# Patient Record
Sex: Male | Born: 1945 | Race: Black or African American | Hispanic: No | Marital: Married | Smoking: Former smoker
Health system: Southern US, Community
[De-identification: ages and names within clinical notes are randomized; demographics above are authoritative.]

## PROBLEM LIST (undated history)

## (undated) DIAGNOSIS — K219 Gastro-esophageal reflux disease without esophagitis: Secondary | ICD-10-CM

## (undated) DIAGNOSIS — I10 Essential (primary) hypertension: Secondary | ICD-10-CM

---

## 2009-06-29 ENCOUNTER — Emergency Department (HOSPITAL_COMMUNITY): Admission: EM | Admit: 2009-06-29 | Discharge: 2009-06-29 | Payer: Self-pay | Admitting: Emergency Medicine

## 2011-02-27 LAB — POCT I-STAT, CHEM 8
Calcium, Ion: 1.11 mmol/L — ABNORMAL LOW (ref 1.12–1.32)
Chloride: 102 mEq/L (ref 96–112)
Glucose, Bld: 111 mg/dL — ABNORMAL HIGH (ref 70–99)
HCT: 57 % — ABNORMAL HIGH (ref 39.0–52.0)
Hemoglobin: 19.4 g/dL — ABNORMAL HIGH (ref 13.0–17.0)
TCO2: 23 mmol/L (ref 0–100)

## 2013-06-30 ENCOUNTER — Other Ambulatory Visit (HOSPITAL_COMMUNITY): Payer: Medicare Other

## 2013-06-30 ENCOUNTER — Encounter (HOSPITAL_COMMUNITY): Payer: Self-pay | Admitting: Unknown Physician Specialty

## 2013-06-30 ENCOUNTER — Emergency Department (HOSPITAL_COMMUNITY): Payer: Non-veteran care

## 2013-06-30 ENCOUNTER — Emergency Department (HOSPITAL_COMMUNITY)
Admission: EM | Admit: 2013-06-30 | Discharge: 2013-06-30 | Disposition: A | Discharge status: Home / Self Care | Payer: Non-veteran care | Attending: Emergency Medicine | Admitting: Emergency Medicine

## 2013-06-30 DIAGNOSIS — R112 Nausea with vomiting, unspecified: Secondary | ICD-10-CM | POA: Insufficient documentation

## 2013-06-30 DIAGNOSIS — Z79899 Other long term (current) drug therapy: Secondary | ICD-10-CM | POA: Insufficient documentation

## 2013-06-30 DIAGNOSIS — R55 Syncope and collapse: Secondary | ICD-10-CM | POA: Insufficient documentation

## 2013-06-30 DIAGNOSIS — Z87891 Personal history of nicotine dependence: Secondary | ICD-10-CM | POA: Insufficient documentation

## 2013-06-30 DIAGNOSIS — R51 Headache: Secondary | ICD-10-CM | POA: Insufficient documentation

## 2013-06-30 DIAGNOSIS — Z8719 Personal history of other diseases of the digestive system: Secondary | ICD-10-CM | POA: Insufficient documentation

## 2013-06-30 DIAGNOSIS — M542 Cervicalgia: Secondary | ICD-10-CM | POA: Insufficient documentation

## 2013-06-30 DIAGNOSIS — I10 Essential (primary) hypertension: Secondary | ICD-10-CM | POA: Insufficient documentation

## 2013-06-30 HISTORY — DX: Gastro-esophageal reflux disease without esophagitis: K21.9

## 2013-06-30 HISTORY — DX: Essential (primary) hypertension: I10

## 2013-06-30 LAB — GLUCOSE, CAPILLARY: Glucose-Capillary: 126 mg/dL — ABNORMAL HIGH (ref 70–99)

## 2013-06-30 LAB — COMPREHENSIVE METABOLIC PANEL
ALT: 13 U/L (ref 0–53)
Calcium: 9.6 mg/dL (ref 8.4–10.5)
GFR calc Af Amer: >90 mL/min (ref >90)
Glucose, Bld: 120 mg/dL — ABNORMAL HIGH (ref 70–99)
Sodium: 140 mEq/L (ref 135–145)
Total Protein: 7.6 g/dL (ref 6.0–8.3)

## 2013-06-30 LAB — CBC
Hemoglobin: 15 g/dL (ref 13.0–17.0)
MCH: 32.1 pg (ref 26.0–34.0)
MCHC: 35.5 g/dL (ref 30.0–36.0)

## 2013-06-30 LAB — URINALYSIS, ROUTINE W REFLEX MICROSCOPIC
Bilirubin Urine: NEGATIVE
Leukocytes, UA: NEGATIVE
Nitrite: NEGATIVE
Specific Gravity, Urine: 1.021 (ref 1.005–1.030)
pH: 6 (ref 5.0–8.0)

## 2013-06-30 LAB — TROPONIN I: Troponin I: <0.3 ng/mL (ref <0.30)

## 2013-06-30 NOTE — ED Provider Notes (Addendum)
CSN: [id number]     Arrival date & time 06/30/13  1105 History     First MD Initiated Contact with Patient 06/30/13 1109     Chief Complaint  Patient presents with  . Loss of Consciousness   (Consider location/radiation/quality/duration/timing/severity/associated sxs/prior Treatment) HPI Comments: 67 year old Latin American male presents emergency department via EMS for syncopal episode. Patient was eating breakfast and he believes he was eating too fast when he felt full sensation in his stomach felt nauseous, vomited, and briefly lost consciousness. Patient denies recent travel, sick contacts, chest pain shortness of breath, abdominal pain, recent trauma, headache, change in medications  Patient is a 67 y.o. male presenting with syncope. The history is provided by the patient and the spouse.  Loss of Consciousness Episode history:  Single Most recent episode:  Today Progression:  Resolved Chronicity:  New Context: normal activity   Witnessed: yes   Relieved by:  Nothing Worsened by:  Nothing tried Associated symptoms: nausea and vomiting   Associated symptoms: no chest pain, no dizziness, no headaches, no palpitations, no seizures and no weakness   Vomiting:    Quality:  Stomach contents   Severity:  Mild   Past Medical History  Diagnosis Date  . Hypertension   . GERD (gastroesophageal reflux disease)    History reviewed. No pertinent past surgical history. No family history on file. History  Substance Use Topics  . Smoking status: Former Smoker    Quit date: 06/30/2012  . Smokeless tobacco: Not on file  . Alcohol Use: Yes     Comment: occassional    Review of Systems  Constitutional: Negative for chills, activity change and appetite change.  HENT: Positive for neck pain. Negative for hearing loss, facial swelling, neck stiffness and ear discharge.   Eyes: Negative.   Respiratory: Negative for apnea, cough, choking and chest tightness.   Cardiovascular: Positive  for syncope. Negative for chest pain, palpitations and leg swelling.  Gastrointestinal: Positive for nausea and vomiting. Negative for abdominal pain, diarrhea, constipation, blood in stool, abdominal distention, anal bleeding and rectal pain.  Endocrine: Negative.   Genitourinary: Negative.   Neurological: Positive for syncope and light-headedness. Negative for dizziness, tremors, seizures, facial asymmetry, speech difficulty, weakness, numbness and headaches.  Hematological: Negative.     Allergies  Review of patient's allergies indicates no known allergies.  Home Medications   Current Outpatient Rx  Name  Route  Sig  Dispense  Refill  . amLODipine (NORVASC) 10 MG tablet   Oral   Take 5 mg by mouth daily.         Marland Kitchen ascorbic acid (VITAMIN C) 500 MG tablet   Oral   Take 500 mg by mouth 3 (three) times daily with meals.         . fish oil-omega-3 fatty acids 1000 MG capsule   Oral   Take 2 g by mouth daily.         . Multiple Vitamin (DAILY VITE) TABS   Oral   Take 1 tablet by mouth daily.         . ranitidine (ZANTAC) 150 MG tablet   Oral   Take 150 mg by mouth 2 (two) times daily.         Marland Kitchen senna-docusate (SENOKOT-S) 8.6-50 MG per tablet   Oral   Take 2 tablets by mouth 2 (two) times daily.         . tamsulosin (FLOMAX) 0.4 MG CAPS capsule   Oral   Take 0.4  mg by mouth at bedtime.          BP 104/63  Pulse 55  Temp(Src) 97.6 F (36.4 C) (Oral)  Resp 19  SpO2 97% Physical Exam  Nursing note and vitals reviewed. Constitutional: He is oriented to person, place, and time. He appears well-developed and well-nourished.  HENT:  Head: Normocephalic and atraumatic.  Eyes: Conjunctivae and EOM are normal. Pupils are equal, round, and reactive to light.  Neck: Normal range of motion. Neck supple. No JVD present. No tracheal deviation present. No thyromegaly present.  Full range of motion with neck, no step-offs no deviations, no bruits, no JVD   Cardiovascular: Normal rate, regular rhythm and intact distal pulses.   Pulmonary/Chest: Effort normal and breath sounds normal. No stridor.  Abdominal: Soft. Bowel sounds are normal. He exhibits no distension and no mass. There is no tenderness. There is no rebound and no guarding.  Musculoskeletal: Normal range of motion.  Lymphadenopathy:    He has no cervical adenopathy.  Neurological: He is alert and oriented to person, place, and time. He has normal reflexes. No cranial nerve deficit. Coordination normal.  Moving all extremities, muscle strength 5 out of 5 and symmetric, no facial droop, no slurred speech, negative pronator drift the  Skin: Skin is warm and dry.    ED Course    Date: 06/30/2013  Rate: 58  Rhythm: normal sinus rhythm  QRS Axis: normal  Intervals: normal  ST/T Wave abnormalities: nonspecific ST changes  Conduction Disutrbances:none  Narrative Interpretation:   Old EKG Reviewed: none available    Procedures (including critical care time)  Labs Reviewed  COMPREHENSIVE METABOLIC PANEL - Abnormal; Notable for the following:    Glucose, Bld 120 (*)    All other components within normal limits  GLUCOSE, CAPILLARY - Abnormal; Notable for the following:    Glucose-Capillary 126 (*)    All other components within normal limits  CBC  URINALYSIS, ROUTINE W REFLEX MICROSCOPIC  TROPONIN I   Ct Head Wo Contrast (only If Suspected Head Trauma And/or Pt Is On Anticoagulant)  06/30/2013   *RADIOLOGY REPORT*  Clinical Data: Loss of consciousness.  CT HEAD WITHOUT CONTRAST  Technique:  Contiguous axial images were obtained from the base of the skull through the vertex without contrast.  Comparison: None  Findings: Mild atrophy.  Negative for hydrocephalus.  Mild chronic microvascular ischemic changes in the white matter.  No acute infarct.  Negative for hemorrhage or mass.  No skull lesion identified.  IMPRESSION: Atrophy and mild chronic microvascular ischemia.  No acute  abnormality.   Original Report Authenticated By: Janeece Riggers, M.D.   No diagnosis found. Results for orders placed during the hospital encounter of 06/30/13  CBC      Result Value Range   WBC 5.5  4.0 - 10.5 K/uL   RBC 4.67  4.22 - 5.81 MIL/uL   Hemoglobin 15.0  13.0 - 17.0 g/dL   HCT 84.6  96.2 - 95.2 %   MCV 90.4  78.0 - 100.0 fL   MCH 32.1  26.0 - 34.0 pg   MCHC 35.5  30.0 - 36.0 g/dL   RDW 84.1  32.4 - 40.1 %   Platelets 290  150 - 400 K/uL  COMPREHENSIVE METABOLIC PANEL      Result Value Range   Sodium 140  135 - 145 mEq/L   Potassium 3.5  3.5 - 5.1 mEq/L   Chloride 103  96 - 112 mEq/L   CO2 25  19 - 32 mEq/L   Glucose, Bld 120 (*) 70 - 99 mg/dL   BUN 9  6 - 23 mg/dL   Creatinine, Ser 1.61  0.50 - 1.35 mg/dL   Calcium 9.6  8.4 - 09.6 mg/dL   Total Protein 7.6  6.0 - 8.3 g/dL   Albumin 3.8  3.5 - 5.2 g/dL   AST 26  0 - 37 U/L   ALT 13  0 - 53 U/L   Alkaline Phosphatase 51  39 - 117 U/L   Total Bilirubin 0.7  0.3 - 1.2 mg/dL   GFR calc non Af Amer >90  >90 mL/min   GFR calc Af Amer >90  >90 mL/min  URINALYSIS, ROUTINE W REFLEX MICROSCOPIC      Result Value Range   Color, Urine YELLOW  YELLOW   APPearance CLEAR  CLEAR   Specific Gravity, Urine 1.021  1.005 - 1.030   pH 6.0  5.0 - 8.0   Glucose, UA NEGATIVE  NEGATIVE mg/dL   Hgb urine dipstick NEGATIVE  NEGATIVE   Bilirubin Urine NEGATIVE  NEGATIVE   Ketones, ur NEGATIVE  NEGATIVE mg/dL   Protein, ur NEGATIVE  NEGATIVE mg/dL   Urobilinogen, UA 1.0  0.0 - 1.0 mg/dL   Nitrite NEGATIVE  NEGATIVE   Leukocytes, UA NEGATIVE  NEGATIVE  TROPONIN I      Result Value Range   Troponin I <0.30  <0.30 ng/mL  GLUCOSE, CAPILLARY      Result Value Range   Glucose-Capillary 126 (*) 70 - 99 mg/dL    0:45 PM Patient is symptom free without complaint.  The patient and his family request for him to go on. ER stay was unremarkable and there was no ectopy on the monitor.  MDM  67 year old male presents emergency department for  syncope. Patient is alert and oriented, pleasant, and in no distress. His vital signs are normal. EKG shows no ischemic changes. Plan for your workup to include CT head, blood work, urine. We will continue to monitor patient during his ER stay. Plan to discharge patient. No evidence of emergent etiology. Doubt cardiac syncope, acute coronary syndrome, stroke, TIA, serious bacterial illness.  Plan to discharge patient home with follow PCM in one to 3 days. Strict emergency precautions were given.  Pt and family agree with plan.    Darlys Gales, MD 06/30/13 1131  Darlys Gales, MD 06/30/13 [phone]

## 2013-06-30 NOTE — ED Notes (Signed)
Patient arrived via [facility] from home post syncopal episode. Patient was sitting watching TV when wife stated he went out. EMS stated that patient was diaphoretic and had vomit at his feet. Patients EKG was SB. VSS. EMS administered Zofran 4mg  IV. Patient denies CP, shortness of breath.

## 2014-07-07 IMAGING — CT CT HEAD W/O CM
2 series · 16 of 30 positions shown, 20 images · non-contrast
Comparison: None

CLINICAL DATA: Loss of consciousness.

CT HEAD WITHOUT CONTRAST
TECHNIQUE: Contiguous axial images were obtained from the base of
the skull through the vertex without contrast.

[Series 2: head w/o · axial · non-contrast · 0.47mm/px · z∈[+106,+236]mm · 13 of 32 slices shown, 17 images]
[im 3/32  brain]
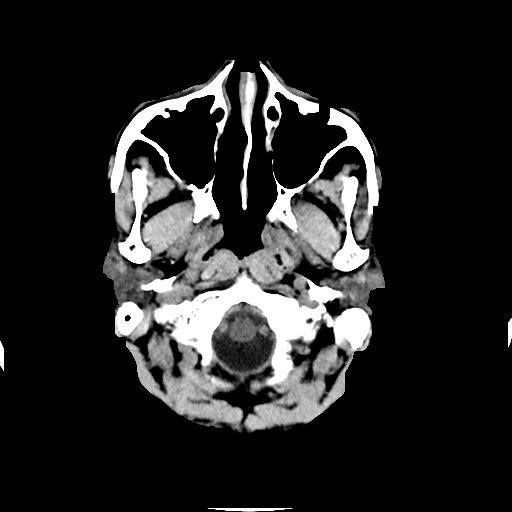
[im 3/32  bone]
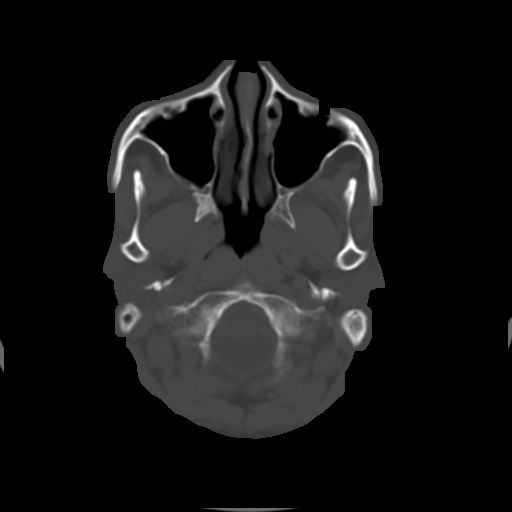
[im 5/32  brain]
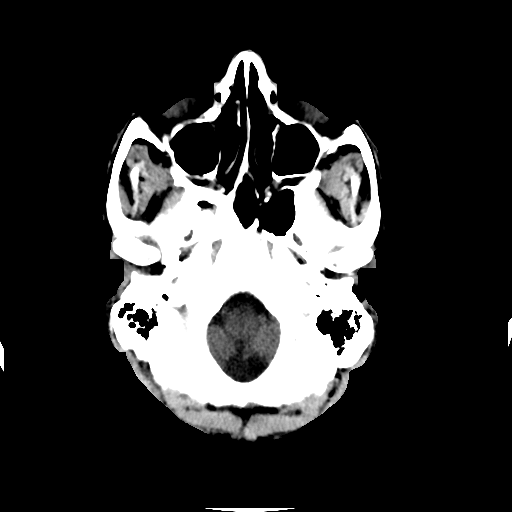
[im 7/32  brain]
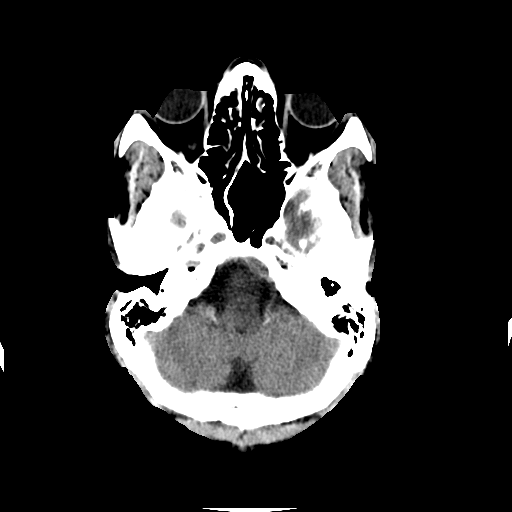
[im 9/32  brain]
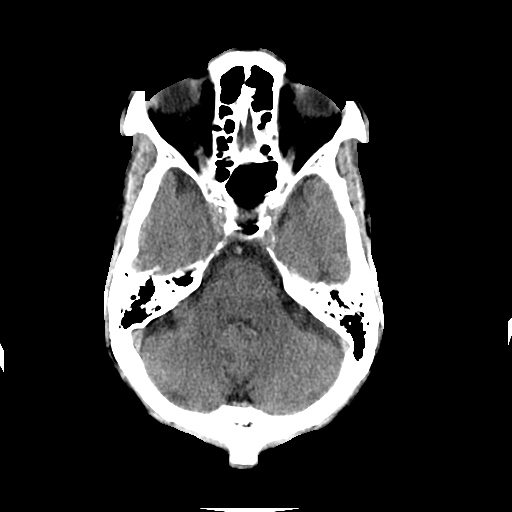
[im 12/32  brain]
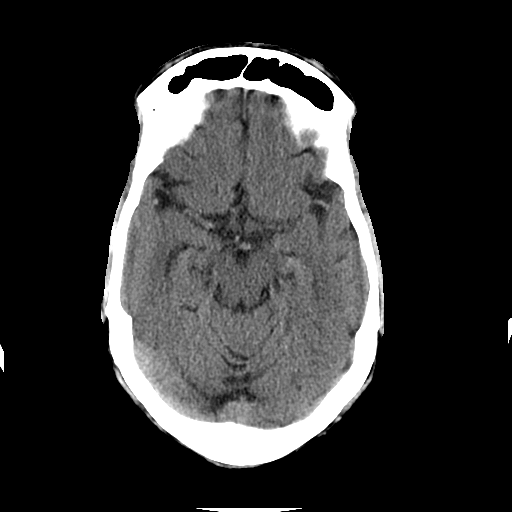
[im 12/32  bone]
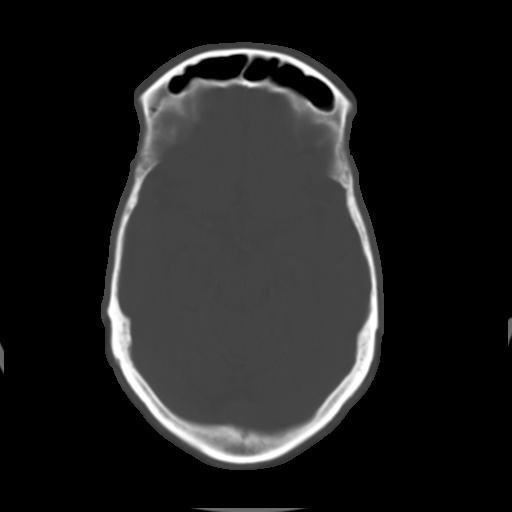
[im 14/32  brain]
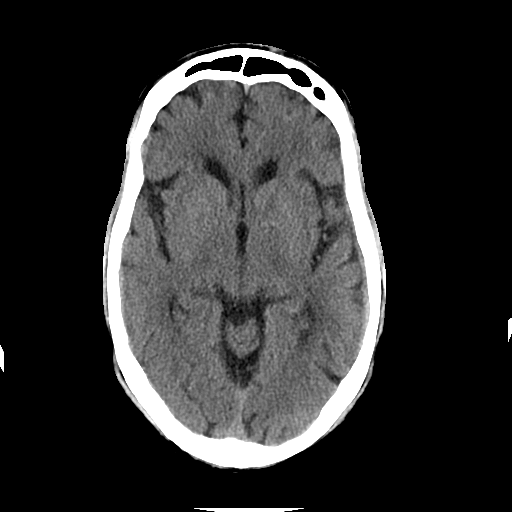
[im 16/32  brain]
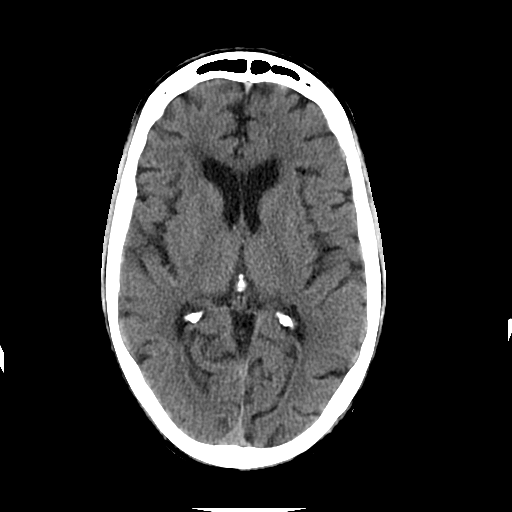
[im 18/32  brain]
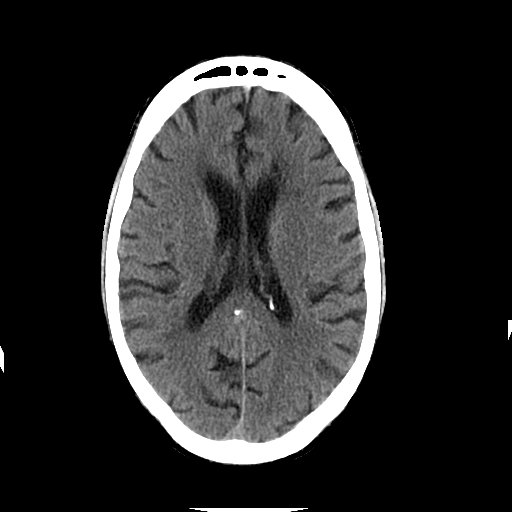
[im 20/32  brain]
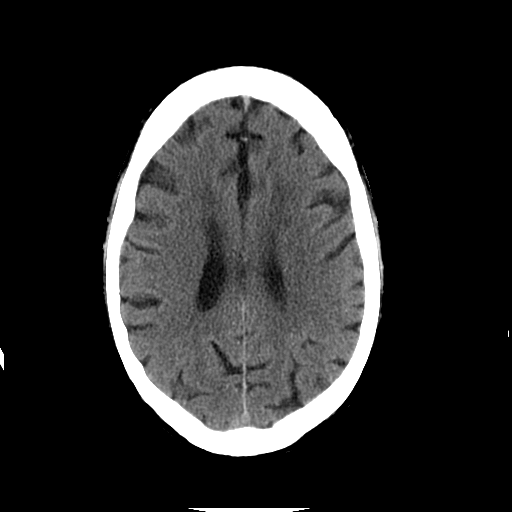
[im 20/32  bone]
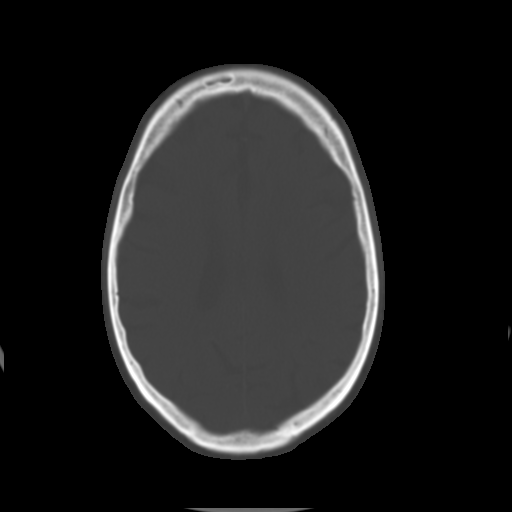
[im 23/32  brain]
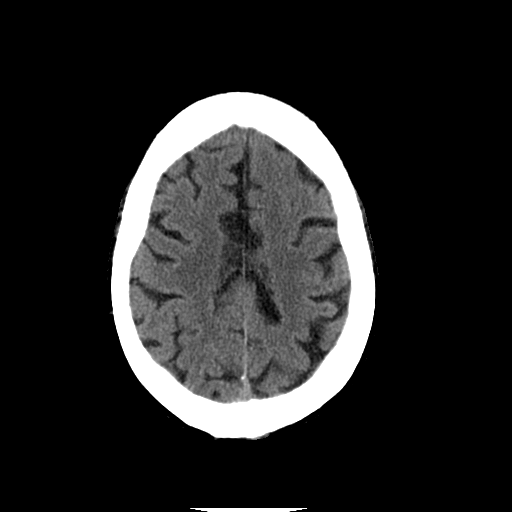
[im 25/32  brain]
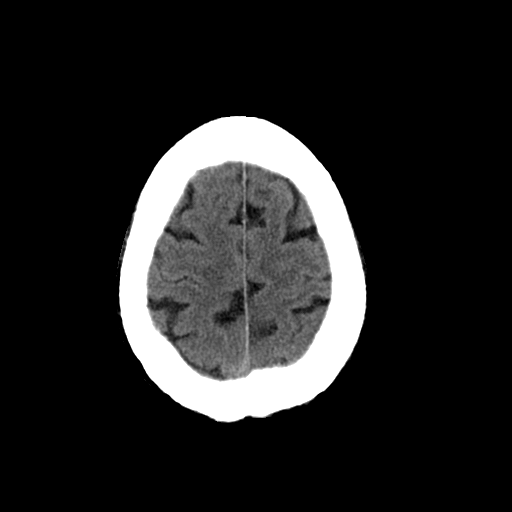
[im 27/32  brain]
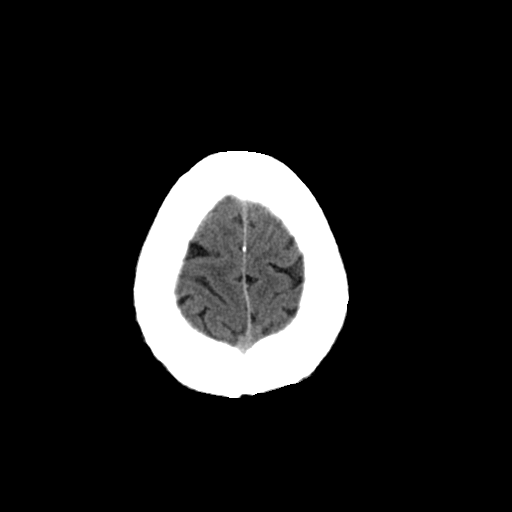
[im 29/32  brain]
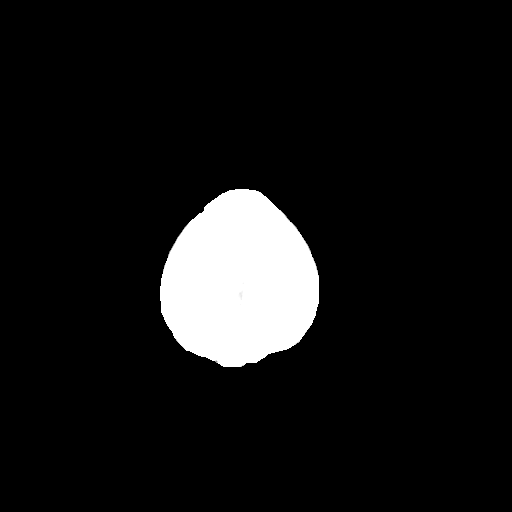
[im 29/32  bone]
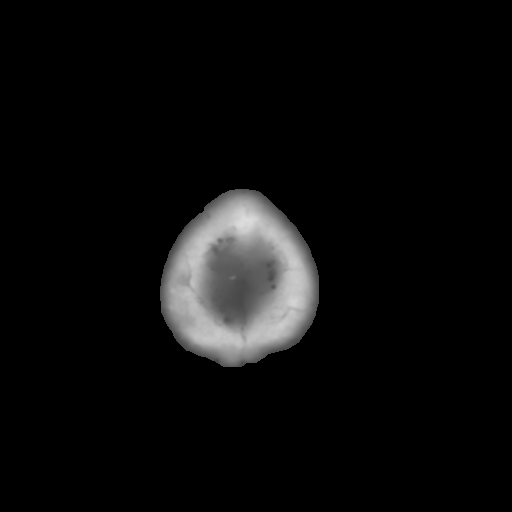

[Series 3: head w/o bone · axial · non-contrast · 0.47mm/px · z∈[+106,+151]mm · 3 of 32 slices shown]
[im 3/32  bone]
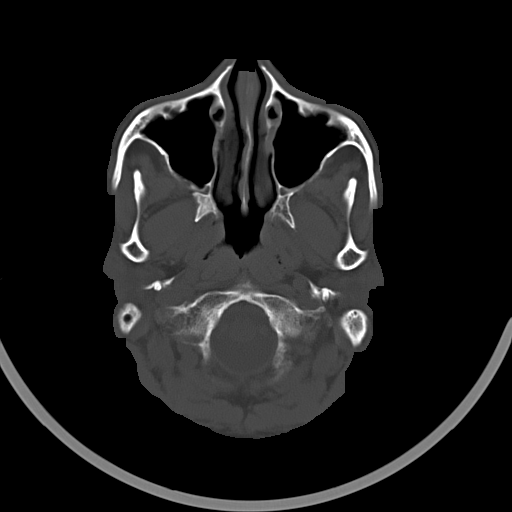
[im 7/32  bone]
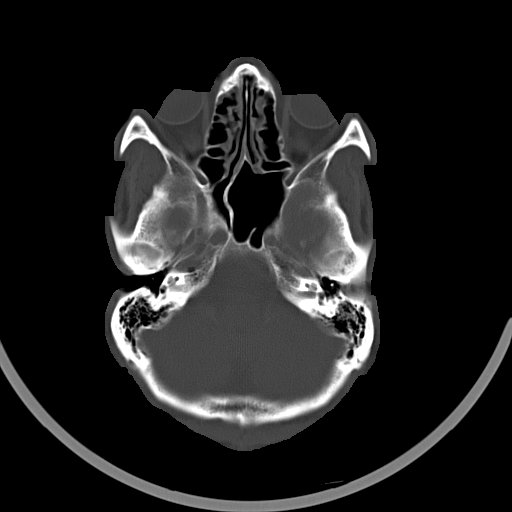
[im 12/32  bone]
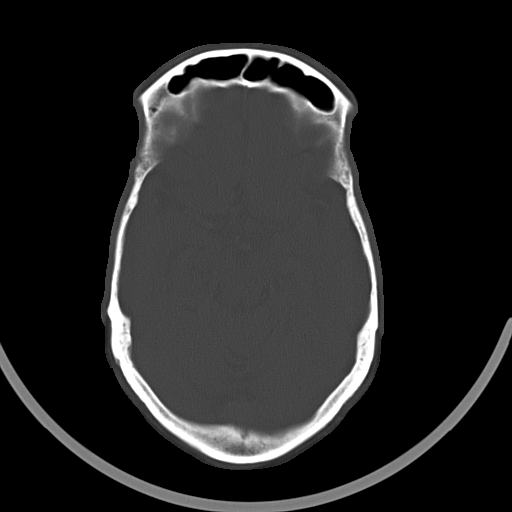

[16 of 30 positions shown; findings below may reference images not displayed]

FINDINGS: Mild atrophy.  Negative for hydrocephalus.  Mild chronic
microvascular ischemic changes in the white matter.  No acute
infarct.  Negative for hemorrhage or mass.  No skull lesion
identified.
IMPRESSION: Atrophy and mild chronic microvascular ischemia.  No acute
abnormality.

## 2016-05-19 ENCOUNTER — Emergency Department (HOSPITAL_COMMUNITY): Payer: No Typology Code available for payment source

## 2016-05-19 ENCOUNTER — Emergency Department (HOSPITAL_COMMUNITY)
Admission: EM | Admit: 2016-05-19 | Discharge: 2016-05-19 | Disposition: A | Discharge status: Home / Self Care | Payer: No Typology Code available for payment source | Attending: Emergency Medicine | Admitting: Emergency Medicine

## 2016-05-19 ENCOUNTER — Encounter (HOSPITAL_COMMUNITY): Payer: Self-pay | Admitting: Emergency Medicine

## 2016-05-19 DIAGNOSIS — Y999 Unspecified external cause status: Secondary | ICD-10-CM | POA: Diagnosis not present

## 2016-05-19 DIAGNOSIS — M25511 Pain in right shoulder: Secondary | ICD-10-CM | POA: Diagnosis not present

## 2016-05-19 DIAGNOSIS — Z79899 Other long term (current) drug therapy: Secondary | ICD-10-CM | POA: Diagnosis not present

## 2016-05-19 DIAGNOSIS — S161XXA Strain of muscle, fascia and tendon at neck level, initial encounter: Secondary | ICD-10-CM | POA: Insufficient documentation

## 2016-05-19 DIAGNOSIS — Y939 Activity, unspecified: Secondary | ICD-10-CM | POA: Insufficient documentation

## 2016-05-19 DIAGNOSIS — Z87891 Personal history of nicotine dependence: Secondary | ICD-10-CM | POA: Insufficient documentation

## 2016-05-19 DIAGNOSIS — S199XXA Unspecified injury of neck, initial encounter: Secondary | ICD-10-CM | POA: Diagnosis present

## 2016-05-19 DIAGNOSIS — Y92410 Unspecified street and highway as the place of occurrence of the external cause: Secondary | ICD-10-CM | POA: Diagnosis not present

## 2016-05-19 DIAGNOSIS — I10 Essential (primary) hypertension: Secondary | ICD-10-CM | POA: Insufficient documentation

## 2016-05-19 MED ORDER — HYDROCODONE-ACETAMINOPHEN 5-325 MG PO TABS: 1.0000 | ORAL_TABLET | Freq: Four times a day (QID) | ORAL | Status: DC | PRN
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
## 2016-05-19 NOTE — Discharge Instructions (Signed)
°Cervical Strain and Sprain With Rehab °Cervical strain and sprain are injuries that commonly occur with "whiplash" injuries. Whiplash occurs when the neck is forcefully whipped backward or forward, such as during a motor vehicle accident or during contact sports. The muscles, ligaments, tendons, discs, and nerves of the neck are susceptible to injury when this occurs. °RISK FACTORS °Risk of having a whiplash injury increases if: °· Osteoarthritis of the spine. °· Situations that make head or neck accidents or trauma more likely. °· High-risk sports (football, rugby, wrestling, hockey, auto racing, gymnastics, diving, contact karate, or boxing). °· Poor strength and flexibility of the neck. °· Previous neck injury. °· Poor tackling technique. °· Improperly fitted or padded equipment. °SYMPTOMS  °· Pain or stiffness in the front or back of neck or both. °· Symptoms may present immediately or up to 24 hours after injury. °· Dizziness, headache, nausea, and vomiting. °· Muscle spasm with soreness and stiffness in the neck. °· Tenderness and swelling at the injury site. °PREVENTION °· Learn and use proper technique (avoid tackling with the head, spearing, and head-butting; use proper falling techniques to avoid landing on the head). °· Warm up and stretch properly before activity. °· Maintain physical fitness: °¨ Strength, flexibility, and endurance. °¨ Cardiovascular fitness. °· Wear properly fitted and padded protective equipment, such as padded soft collars, for participation in contact sports. °PROGNOSIS  °Recovery from cervical strain and sprain injuries is dependent on the extent of the injury. These injuries are usually curable in 1 week to 3 months with appropriate treatment.  °RELATED COMPLICATIONS  °· Temporary numbness and weakness may occur if the nerve roots are damaged, and this may persist until the nerve has completely healed. °· Chronic pain due to frequent recurrence of symptoms. °· Prolonged healing,  especially if activity is resumed too soon (before complete recovery). °TREATMENT  °Treatment initially involves the use of ice and medication to help reduce pain and inflammation. It is also important to perform strengthening and stretching exercises and modify activities that worsen symptoms so the injury does not get worse. These exercises may be performed at home or with a therapist. For patients who experience severe symptoms, a soft, padded collar may be recommended to be worn around the neck.  °Improving your posture may help reduce symptoms. Posture improvement includes pulling your chin and abdomen in while sitting or standing. If you are sitting, sit in a firm chair with your buttocks against the back of the chair. While sleeping, try replacing your pillow with a small towel rolled to 2 inches in diameter, or use a cervical pillow or soft cervical collar. Poor sleeping positions delay healing.  °For patients with nerve root damage, which causes numbness or weakness, the use of a cervical traction apparatus may be recommended. Surgery is rarely necessary for these injuries. However, cervical strain and sprains that are present at birth (congenital) may require surgery. °MEDICATION  °· If pain medication is necessary, nonsteroidal anti-inflammatory medications, such as aspirin and ibuprofen, or other minor pain relievers, such as acetaminophen, are often recommended. °· Do not take pain medication for 7 days before surgery. °· Prescription pain relievers may be given if deemed necessary by your caregiver. Use only as directed and only as much as you need. °HEAT AND COLD:  °· Cold treatment (icing) relieves pain and reduces inflammation. Cold treatment should be applied for 10 to 15 minutes every 2 to 3 hours for inflammation and pain and immediately after any activity that aggravates your   symptoms. Use ice packs or an ice massage. °· Heat treatment may be used prior to performing the stretching and  strengthening activities prescribed by your caregiver, physical therapist, or athletic trainer. Use a heat pack or a warm soak. °SEEK MEDICAL CARE IF:  °· Symptoms get worse or do not improve in 2 weeks despite treatment. °· New, unexplained symptoms develop (drugs used in treatment may produce side effects). °EXERCISES °RANGE OF MOTION (ROM) AND STRETCHING EXERCISES - Cervical Strain and Sprain °These exercises may help you when beginning to rehabilitate your injury. In order to successfully resolve your symptoms, you must improve your posture. These exercises are designed to help reduce the forward-head and rounded-shoulder posture which contributes to this condition. Your symptoms may resolve with or without further involvement from your physician, physical therapist or athletic trainer. While completing these exercises, remember:  °· Restoring tissue flexibility helps normal motion to return to the joints. This allows healthier, less painful movement and activity. °· An effective stretch should be held for at least 20 seconds, although you may need to begin with shorter hold times for comfort. °· A stretch should never be painful. You should only feel a gentle lengthening or release in the stretched tissue. °STRETCH- Axial Extensors °· Lie on your back on the floor. You may bend your knees for comfort. Place a rolled-up hand towel or dish towel, about 2 inches in diameter, under the part of your head that makes contact with the floor. °· Gently tuck your chin, as if trying to make a "double chin," until you feel a gentle stretch at the base of your head. °· Hold __________ seconds. °Repeat __________ times. Complete this exercise __________ times per day.  °STRETCH - Axial Extension  °· Stand or sit on a firm surface. Assume a good posture: chest up, shoulders drawn back, abdominal muscles slightly tense, knees unlocked (if standing) and feet hip width apart. °· Slowly retract your chin so your head slides back  and your chin slightly lowers. Continue to look straight ahead. °· You should feel a gentle stretch in the back of your head. Be certain not to feel an aggressive stretch since this can cause headaches later. °· Hold for __________ seconds. °Repeat __________ times. Complete this exercise __________ times per day. °STRETCH - Cervical Side Bend  °· Stand or sit on a firm surface. Assume a good posture: chest up, shoulders drawn back, abdominal muscles slightly tense, knees unlocked (if standing) and feet hip width apart. °· Without letting your nose or shoulders move, slowly tip your right / left ear to your shoulder until your feel a gentle stretch in the muscles on the opposite side of your neck. °· Hold __________ seconds. °Repeat __________ times. Complete this exercise __________ times per day. °STRETCH - Cervical Rotators  °· Stand or sit on a firm surface. Assume a good posture: chest up, shoulders drawn back, abdominal muscles slightly tense, knees unlocked (if standing) and feet hip width apart. °· Keeping your eyes level with the ground, slowly turn your head until you feel a gentle stretch along the back and opposite side of your neck. °· Hold __________ seconds. °Repeat __________ times. Complete this exercise __________ times per day. °RANGE OF MOTION - Neck Circles  °· Stand or sit on a firm surface. Assume a good posture: chest up, shoulders drawn back, abdominal muscles slightly tense, knees unlocked (if standing) and feet hip width apart. °· Gently roll your head down and around from the back   of one shoulder to the back of the other. The motion should never be forced or painful. °· Repeat the motion 10-20 times, or until you feel the neck muscles relax and loosen. °Repeat __________ times. Complete the exercise __________ times per day. °STRENGTHENING EXERCISES - Cervical Strain and Sprain °These exercises may help you when beginning to rehabilitate your injury. They may resolve your symptoms with or  without further involvement from your physician, physical therapist, or athletic trainer. While completing these exercises, remember:  °· Muscles can gain both the endurance and the strength needed for everyday activities through controlled exercises. °· Complete these exercises as instructed by your physician, physical therapist, or athletic trainer. Progress the resistance and repetitions only as guided. °· You may experience muscle soreness or fatigue, but the pain or discomfort you are trying to eliminate should never worsen during these exercises. If this pain does worsen, stop and make certain you are following the directions exactly. If the pain is still present after adjustments, discontinue the exercise until you can discuss the trouble with your clinician. °STRENGTH - Cervical Flexors, Isometric °· Face a wall, standing about 6 inches away. Place a small pillow, a ball about 6-8 inches in diameter, or a folded towel between your forehead and the wall. °· Slightly tuck your chin and gently push your forehead into the soft object. Push only with mild to moderate intensity, building up tension gradually. Keep your jaw and forehead relaxed. °· Hold 10 to 20 seconds. Keep your breathing relaxed. °· Release the tension slowly. Relax your neck muscles completely before you start the next repetition. °Repeat __________ times. Complete this exercise __________ times per day. °STRENGTH- Cervical Lateral Flexors, Isometric  °· Stand about 6 inches away from a wall. Place a small pillow, a ball about 6-8 inches in diameter, or a folded towel between the side of your head and the wall. °· Slightly tuck your chin and gently tilt your head into the soft object. Push only with mild to moderate intensity, building up tension gradually. Keep your jaw and forehead relaxed. °· Hold 10 to 20 seconds. Keep your breathing relaxed. °· Release the tension slowly. Relax your neck muscles completely before you start the next  repetition. °Repeat __________ times. Complete this exercise __________ times per day. °STRENGTH - Cervical Extensors, Isometric  °· Stand about 6 inches away from a wall. Place a small pillow, a ball about 6-8 inches in diameter, or a folded towel between the back of your head and the wall. °· Slightly tuck your chin and gently tilt your head back into the soft object. Push only with mild to moderate intensity, building up tension gradually. Keep your jaw and forehead relaxed. °· Hold 10 to 20 seconds. Keep your breathing relaxed. °· Release the tension slowly. Relax your neck muscles completely before you start the next repetition. °Repeat __________ times. Complete this exercise __________ times per day. °POSTURE AND BODY MECHANICS CONSIDERATIONS - Cervical Strain and Sprain °Keeping correct posture when sitting, standing or completing your activities will reduce the stress put on different body tissues, allowing injured tissues a chance to heal and limiting painful experiences. The following are general guidelines for improved posture. Your physician or physical therapist will provide you with any instructions specific to your needs. While reading these guidelines, remember: °· The exercises prescribed by your provider will help you have the flexibility and strength to maintain correct postures. °· The correct posture provides the optimal environment for your joints to work.   All of your joints have less wear and tear when properly supported by a spine with good posture. This means you will experience a healthier, less painful body. °· Correct posture must be practiced with all of your activities, especially prolonged sitting and standing. Correct posture is as important when doing repetitive low-stress activities (typing) as it is when doing a single heavy-load activity (lifting). °PROLONGED STANDING WHILE SLIGHTLY LEANING FORWARD °When completing a task that requires you to lean forward while standing in one  place for a long time, place either foot up on a stationary 2- to 4-inch high object to help maintain the best posture. When both feet are on the ground, the low back tends to lose its slight inward curve. If this curve flattens (or becomes too large), then the back and your other joints will experience too much stress, fatigue more quickly, and can cause pain.  °RESTING POSITIONS °Consider which positions are most painful for you when choosing a resting position. If you have pain with flexion-based activities (sitting, bending, stooping, squatting), choose a position that allows you to rest in a less flexed posture. You would want to avoid curling into a fetal position on your side. If your pain worsens with extension-based activities (prolonged standing, working overhead), avoid resting in an extended position such as sleeping on your stomach. Most people will find more comfort when they rest with their spine in a more neutral position, neither too rounded nor too arched. Lying on a non-sagging bed on your side with a pillow between your knees, or on your back with a pillow under your knees will often provide some relief. Keep in mind, being in any one position for a prolonged period of time, no matter how correct your posture, can still lead to stiffness. °WALKING °Walk with an upright posture. Your ears, shoulders, and hips should all line up. °OFFICE WORK °When working at a desk, create an environment that supports good, upright posture. Without extra support, muscles fatigue and lead to excessive strain on joints and other tissues. °CHAIR: °· A chair should be able to slide under your desk when your back makes contact with the back of the chair. This allows you to work closely. °· The chair's height should allow your eyes to be level with the upper part of your monitor and your hands to be slightly lower than your elbows. °· Body position: °¨ Your feet should make contact with the floor. If this is not  possible, use a foot rest. °¨ Keep your ears over your shoulders. This will reduce stress on your neck and low back. °  °This information is not intended to replace advice given to you by your health care provider. Make sure you discuss any questions you have with your health care provider. °  °Document Released: 11/07/2005 Document Revised: 11/28/2014 Document Reviewed: 02/19/2009 °[employer] Interactive Patient Education ©2016 [employer] °Motor Vehicle Collision °It is common to have multiple bruises and sore muscles after a motor vehicle collision (MVC). These tend to feel worse for the first 24 hours. You may have the most stiffness and soreness over the first several hours. You may also feel worse when you wake up the first morning after your collision. After this point, you will usually begin to improve with each day. The speed of improvement often depends on the severity of the collision, the number of injuries, and the location and nature of these injuries. °HOME CARE INSTRUCTIONS °· Put ice on the injured area. °·   Put ice in a plastic bag. °· Place a towel between your skin and the bag. °· Leave the ice on for 15-20 minutes, 3-4 times a day, or as directed by your health care provider. °· Drink enough fluids to keep your urine clear or pale yellow. Do not drink alcohol. °· Take a warm shower or bath once or twice a day. This will increase blood flow to sore muscles. °· You may return to activities as directed by your caregiver. Be careful when lifting, as this may aggravate neck or back pain. °· Only take over-the-counter or prescription medicines for pain, discomfort, or fever as directed by your caregiver. Do not use aspirin. This may increase bruising and bleeding. °SEEK IMMEDIATE MEDICAL CARE IF: °· You have numbness, tingling, or weakness in the arms or legs. °· You develop severe headaches not relieved with medicine. °· You have severe neck pain, especially tenderness in the middle of the back of  your neck. °· You have changes in bowel or bladder control. °· There is increasing pain in any area of the body. °· You have shortness of breath, light-headedness, dizziness, or fainting. °· You have chest pain. °· You feel sick to your stomach (nauseous), throw up (vomit), or sweat. °· You have increasing abdominal discomfort. °· There is blood in your urine, stool, or vomit. °· You have pain in your shoulder (shoulder strap areas). °· You feel your symptoms are getting worse. °MAKE SURE YOU: °· Understand these instructions. °· Will watch your condition. °· Will get help right away if you are not doing well or get worse. °  °This information is not intended to replace advice given to you by your health care provider. Make sure you discuss any questions you have with your health care provider. °  °Document Released: 11/07/2005 Document Revised: 11/28/2014 Document Reviewed: 04/06/2011 °[employer] Interactive Patient Education ©2016 [employer] ° °
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
## 2018-09-12 ENCOUNTER — Emergency Department (HOSPITAL_COMMUNITY): Payer: Non-veteran care

## 2018-09-12 ENCOUNTER — Encounter (HOSPITAL_COMMUNITY): Payer: Self-pay | Admitting: *Deleted

## 2018-09-12 ENCOUNTER — Emergency Department (HOSPITAL_COMMUNITY)
Admission: EM | Admit: 2018-09-12 | Discharge: 2018-09-12 | Disposition: A | Discharge status: Home / Self Care | Payer: Non-veteran care | Attending: Emergency Medicine | Admitting: Emergency Medicine

## 2018-09-12 DIAGNOSIS — I10 Essential (primary) hypertension: Secondary | ICD-10-CM | POA: Insufficient documentation

## 2018-09-12 DIAGNOSIS — Z79899 Other long term (current) drug therapy: Secondary | ICD-10-CM | POA: Insufficient documentation

## 2018-09-12 DIAGNOSIS — J441 Chronic obstructive pulmonary disease with (acute) exacerbation: Secondary | ICD-10-CM | POA: Insufficient documentation

## 2018-09-12 DIAGNOSIS — Z87891 Personal history of nicotine dependence: Secondary | ICD-10-CM | POA: Insufficient documentation

## 2018-09-12 LAB — BASIC METABOLIC PANEL
ANION GAP: 10 (ref 5–15)
BUN: 7 mg/dL — ABNORMAL LOW (ref 8–23)
CHLORIDE: 102 mmol/L (ref 98–111)
CO2: 27 mmol/L (ref 22–32)
CREATININE: 0.9 mg/dL (ref 0.61–1.24)
Calcium: 10.1 mg/dL (ref 8.9–10.3)
GFR calc non Af Amer: >60 mL/min (ref >60)
Glucose, Bld: 124 mg/dL — ABNORMAL HIGH (ref 70–99)
POTASSIUM: 3.8 mmol/L (ref 3.5–5.1)
SODIUM: 139 mmol/L (ref 135–145)

## 2018-09-12 LAB — CBC
HCT: 49.3 % (ref 39.0–52.0)
HEMOGLOBIN: 16.2 g/dL (ref 13.0–17.0)
MCH: 31.3 pg (ref 26.0–34.0)
MCHC: 32.9 g/dL (ref 30.0–36.0)
MCV: 95.4 fL (ref 80.0–100.0)
NRBC: 0 % (ref 0.0–0.2)
Platelets: 395 10*3/uL (ref 150–400)
RBC: 5.17 MIL/uL (ref 4.22–5.81)
RDW: 12.8 % (ref 11.5–15.5)
WBC: 7.6 10*3/uL (ref 4.0–10.5)

## 2018-09-12 LAB — I-STAT TROPONIN, ED: Troponin i, poc: 0.01 ng/mL (ref 0.00–0.08)

## 2018-09-12 MED ORDER — ALBUTEROL (5 MG/ML) CONTINUOUS INHALATION SOLN
15.0000 mg/h | INHALATION_SOLUTION | Freq: Once | RESPIRATORY_TRACT | Status: AC
  Administered 2018-09-12: 15 mg/h via RESPIRATORY_TRACT
  Filled 2018-09-12: qty 20

## 2018-09-12 MED ORDER — SODIUM CHLORIDE 0.9 % IV BOLUS
500.0000 mL | Freq: Once | INTRAVENOUS | Status: AC
  Administered 2018-09-12: 500 mL via INTRAVENOUS

## 2018-09-12 MED ORDER — METHYLPREDNISOLONE SODIUM SUCC 125 MG IJ SOLR
125.0000 mg | Freq: Once | INTRAMUSCULAR | Status: AC
  Administered 2018-09-12: 125 mg via INTRAVENOUS
  Filled 2018-09-12: qty 2

## 2018-09-12 MED ORDER — ALBUTEROL SULFATE (2.5 MG/3ML) 0.083% IN NEBU
5.0000 mg | INHALATION_SOLUTION | Freq: Once | RESPIRATORY_TRACT | Status: AC
  Administered 2018-09-12: 5 mg via RESPIRATORY_TRACT
  Filled 2018-09-12: qty 6

## 2018-09-12 NOTE — ED Provider Notes (Signed)
[facility] EMERGENCY DEPARTMENT Provider Note   CSN: [id number] Arrival date & time: 09/12/18  0749     History   Chief Complaint Chief Complaint  Patient presents with  . Chest Pain  . Shortness of Breath    HPI Daniel Compton is a 72 y.o. male.  HPI   He presents for evaluation of cough, nonproductive, with periods of syncope.  No hemoptysis or significant sputum production.  He was seen yesterday at the [facility] hospital, started on nebulizer to use instead of his inhaler, and given a dose of prednisone with a taper prescription.  He feels like the nebulizer "locks me up."  He has not started taking the prednisone yet.  He denies fever, chills, chest pain, weakness or dizziness.  The syncope is preceded by coughing.  He denies focal weakness or paresthesia.  There are no other known modifying factors.  Past Medical History:  Diagnosis Date  . GERD (gastroesophageal reflux disease)   . Hypertension     There are no active problems to display for this patient.   History reviewed. No pertinent surgical history.      Home Medications    Prior to Admission medications   Medication Sig Start Date End Date Taking? Authorizing Provider  amLODipine (NORVASC) 10 MG tablet Take 5 mg by mouth daily.   Yes [provider]  ascorbic acid (VITAMIN C) 500 MG tablet Take 500 mg by mouth 3 (three) times daily with meals.   Yes [provider]  Multiple Vitamin (DAILY VITE) TABS Take 1 tablet by mouth daily.   Yes [provider]  ranitidine (ZANTAC) 150 MG tablet Take 150 mg by mouth 2 (two) times daily.   Yes [provider]  senna-docusate (SENOKOT-S) 8.6-50 MG per tablet Take 2 tablets by mouth 2 (two) times daily.   Yes [provider]  tamsulosin (FLOMAX) 0.4 MG CAPS capsule Take 0.4 mg by mouth at bedtime.   Yes [provider]  HYDROcodone-acetaminophen (NORCO/VICODIN) 5-325 MG tablet Take 1-2 tablets by mouth  every 6 (six) hours as needed. Patient not taking: Reported on 09/12/2018 05/19/16   Roxy Horseman, PA-C    Family History History reviewed. No pertinent family history.  Social History Social History   Tobacco Use  . Smoking status: Former Smoker    Last attempt to quit: 06/30/2012    Years since quitting: 6.2  Substance Use Topics  . Alcohol use: Yes    Comment: occassional  . Drug use: No     Allergies   Patient has no known allergies.   Review of Systems Review of Systems  All other systems reviewed and are negative.    Physical Exam Updated Vital Signs BP (!) 149/109   Pulse (!) 101   Temp 98 F (36.7 C) (Oral)   Resp 18   SpO2 94%   Physical Exam  Constitutional: He is oriented to person, place, and time. He appears well-developed and well-nourished. He appears ill (Uncomfortable). No distress.  Elderly, frail  HENT:  Head: Normocephalic and atraumatic.  Right Ear: External ear normal.  Left Ear: External ear normal.  Mouth/Throat: Oropharynx is clear and moist.  Eyes: Pupils are equal, round, and reactive to light. Conjunctivae and EOM are normal.  Neck: Normal range of motion and phonation normal. Neck supple.  Cardiovascular: Regular rhythm and normal heart sounds.  Tachycardic  Pulmonary/Chest: Effort normal. No respiratory distress. He exhibits no tenderness and no bony tenderness.  Decreased  air movement bilaterally with a few scattered wheezes and rhonchi.  First exam after initial nebulizer done at triage.  Abdominal: Soft. There is no tenderness. There is no guarding.  Musculoskeletal: Normal range of motion. He exhibits no edema or deformity.  Neurological: He is alert and oriented to person, place, and time. No cranial nerve deficit or sensory deficit. He exhibits normal muscle tone. Coordination normal.  Skin: Skin is warm, dry and intact.  Psychiatric: He has a normal mood and affect. His behavior is normal. Judgment and thought content  normal.  Nursing note and vitals reviewed.    ED Treatments / Results  Labs (all labs ordered are listed, but only abnormal results are displayed) Labs Reviewed  BASIC METABOLIC PANEL - Abnormal; Notable for the following components:      Result Value   Glucose, Bld 124 (*)    BUN 7 (*)    All other components within normal limits  CBC  I-STAT TROPONIN, ED    EKG EKG Interpretation  Date/Time:  Wednesday September 12 2018 07:55:29 EDT Ventricular Rate:  109 PR Interval:  182 QRS Duration: 76 QT Interval:  320 QTC Calculation: 430 R Axis:   80 Text Interpretation:  Sinus tachycardia Nonspecific ST abnormality Abnormal ECG Since last tracing rate faster Confirmed by Mancel Bale [phone]) on 09/12/2018 8:32:27 AM   Radiology Dg Chest 2 View  Result Date: 09/12/2018 CLINICAL DATA:  Chest pain. EXAM: CHEST - 2 VIEW COMPARISON:  None. FINDINGS: The heart size and mediastinal contours are within normal limits. Both lungs are clear. No pneumothorax or pleural effusion is noted. The visualized skeletal structures are unremarkable. IMPRESSION: No active cardiopulmonary disease. Electronically Signed   By: Lupita Raider, M.D.   On: 09/12/2018 08:46    Procedures .Critical Care Performed by: Mancel Bale, MD Authorized by: Mancel Bale, MD   Critical care provider statement:    Critical care time (minutes):  35   Critical care start time:  09/12/2018 8:30 AM   Critical care end time:  09/12/2018 12:06 PM   Critical care time was exclusive of:  Separately billable procedures and treating other patients   Critical care was necessary to treat or prevent imminent or life-threatening deterioration of the following conditions:  Respiratory failure   Critical care was time spent personally by me on the following activities:  Blood draw for specimens, development of treatment plan with patient or surrogate, discussions with consultants, evaluation of patient's response to treatment,  examination of patient, obtaining history from patient or surrogate, ordering and performing treatments and interventions, ordering and review of laboratory studies, pulse oximetry, re-evaluation of patient's condition, review of old charts and ordering and review of radiographic studies   (including critical care time)  Medications Ordered in ED Medications  albuterol (PROVENTIL) (2.5 MG/3ML) 0.083% nebulizer solution 5 mg (5 mg Nebulization Given 09/12/18 0800)  sodium chloride 0.9 % bolus 500 mL (0 mLs Intravenous Stopped 09/12/18 0958)  methylPREDNISolone sodium succinate (SOLU-MEDROL) 125 mg/2 mL injection 125 mg (125 mg Intravenous Given 09/12/18 0852)  albuterol (PROVENTIL,VENTOLIN) solution continuous neb (15 mg/hr Nebulization Given 09/12/18 0848)     Initial Impression / Assessment and Plan / ED Course  I have reviewed the triage vital signs and the nursing notes.  Pertinent labs & imaging results that were available during my care of the patient were reviewed by me and considered in my medical decision making (see chart for details).  Clinical Course as of Sep 13 1207  Wed Sep 12, 2018  0834 Normal  I-stat troponin, ED [EW]  [phone] Normal  CBC [EW]  [phone] Consistent with COPD, no infiltrate or CHF, images reviewed by me  DG Chest 2 View [EW]  1201 Normal except glucose high, BUN low  Basic metabolic panel(!) [EW]    Clinical Course User Index [EW] Mancel Bale, MD     Patient Vitals for the past 24 hrs:  BP Temp Temp src Pulse Resp SpO2  09/12/18 1156 (!) 149/109 - - (!) 101 18 94 %  09/12/18 1130 (!) 150/107 - - (!) 108 (!) 22 93 %  09/12/18 1015 (!) 160/120 - - (!) 105 (!) 24 95 %  09/12/18 0945 (!) 142/100 - - (!) 102 18 100 %  09/12/18 0900 (!) 139/103 - - 100 20 94 %  09/12/18 0848 (!) 144/111 - - (!) 114 (!) 22 95 %  09/12/18 0845 - - - (!) 112 (!) 23 95 %  09/12/18 0830 (!) 160/96 98 F (36.7 C) Oral (!) 105 18 95 %  09/12/18 0754 (!) 182/118 98 F (36.7  C) Oral (!) 110 (!) 24 94 %    12:02 PM Reevaluation with update and discussion. After initial assessment and treatment, an updated evaluation reveals patient states he feels "much better."  Lungs have improved air movement at this time.Mancel Bale   Medical Decision Making: Evaluation consistent with COPD exacerbation.  Doubt pneumonia, PE or serious bacterial infection.  Patient treated with multiple abilities, Solu-Medrol with improvement.  CRITICAL CARE-yes Performed by: Mancel Bale  Nursing Notes Reviewed/ Care Coordinated Applicable Imaging Reviewed Interpretation of Laboratory Data incorporated into ED treatment  The patient appears reasonably screened and/or stabilized for discharge and I doubt any other medical condition or other [facility] requiring further screening, evaluation, or treatment in the ED at this time prior to discharge.  Plan: Home Medications-continue current; Home Treatments-rest, fluids; return here if the recommended treatment, does not improve the symptoms; Recommended follow up-PCP follow-up next week and as needed.   Final Clinical Impressions(s) / ED Diagnoses   Final diagnoses:  COPD exacerbation [facility])    ED Discharge Orders    None       Mancel Bale, MD 09/12/18 1208

## 2018-09-12 NOTE — ED Notes (Signed)
Family at bedside. 

## 2018-09-12 NOTE — ED Triage Notes (Signed)
Pt in c/o chest pain and shortness of breath that started about a month ago and has been getting progressively worse, went to the [place] yesterday and was started on a new medication, woke up during the night and was having more trouble breathing, speaking in full sentences in triage, audible wheezing noted, also reports cough and congestion

## 2018-09-12 NOTE — Discharge Instructions (Addendum)
Start taking the prednisone prescription around 6 PM tonight.  Use your nebulizer every 3 or 4 hours as needed for cough or trouble breathing.  Return here if needed.  See your doctor next week for a checkup.

## 2018-09-12 NOTE — ED Notes (Signed)
ED Provider at bedside. 

## 2020-10-08 ENCOUNTER — Ambulatory Visit (HOSPITAL_COMMUNITY)
Admission: EM | Admit: 2020-10-08 | Discharge: 2020-10-08 | Disposition: A | Discharge status: Home / Self Care | Payer: No Typology Code available for payment source | Attending: Emergency Medicine | Admitting: Emergency Medicine

## 2020-10-08 ENCOUNTER — Emergency Department (HOSPITAL_COMMUNITY): Payer: No Typology Code available for payment source

## 2020-10-08 ENCOUNTER — Other Ambulatory Visit: Payer: Self-pay | Admitting: Plastic Surgery

## 2020-10-08 ENCOUNTER — Other Ambulatory Visit: Payer: Self-pay

## 2020-10-08 ENCOUNTER — Encounter (HOSPITAL_COMMUNITY): Payer: Self-pay | Admitting: Emergency Medicine

## 2020-10-08 ENCOUNTER — Encounter (HOSPITAL_COMMUNITY)
Admission: EM | Disposition: A | Discharge status: Home / Self Care | Payer: Self-pay | Source: Home / Self Care | Attending: Emergency Medicine

## 2020-10-08 ENCOUNTER — Emergency Department (HOSPITAL_COMMUNITY): Payer: No Typology Code available for payment source | Admitting: Anesthesiology

## 2020-10-08 DIAGNOSIS — Z20822 Contact with and (suspected) exposure to covid-19: Secondary | ICD-10-CM | POA: Diagnosis not present

## 2020-10-08 DIAGNOSIS — W231XXA Caught, crushed, jammed, or pinched between stationary objects, initial encounter: Secondary | ICD-10-CM | POA: Diagnosis not present

## 2020-10-08 DIAGNOSIS — Z23 Encounter for immunization: Secondary | ICD-10-CM | POA: Diagnosis not present

## 2020-10-08 DIAGNOSIS — S62644B Nondisplaced fracture of proximal phalanx of right ring finger, initial encounter for open fracture: Secondary | ICD-10-CM

## 2020-10-08 DIAGNOSIS — S61411A Laceration without foreign body of right hand, initial encounter: Secondary | ICD-10-CM | POA: Insufficient documentation

## 2020-10-08 DIAGNOSIS — Z87891 Personal history of nicotine dependence: Secondary | ICD-10-CM | POA: Insufficient documentation

## 2020-10-08 DIAGNOSIS — S62614A Displaced fracture of proximal phalanx of right ring finger, initial encounter for closed fracture: Secondary | ICD-10-CM

## 2020-10-08 DIAGNOSIS — S61219A Laceration without foreign body of unspecified finger without damage to nail, initial encounter: Secondary | ICD-10-CM

## 2020-10-08 HISTORY — PX: PERCUTANEOUS PINNING: SHX2209

## 2020-10-08 LAB — RESPIRATORY PANEL BY RT PCR (FLU A&B, COVID)
Influenza A by PCR: NEGATIVE
Influenza B by PCR: NEGATIVE
SARS Coronavirus 2 by RT PCR: NEGATIVE

## 2020-10-08 SURGERY — PINNING, EXTREMITY, PERCUTANEOUS
Anesthesia: General | Laterality: Right

## 2020-10-08 MED ORDER — LACTATED RINGERS IV SOLN: INTRAVENOUS | Status: DC | PRN

## 2020-10-08 MED ORDER — PROPOFOL 10 MG/ML IV BOLUS
INTRAVENOUS | Status: AC
  Filled 2020-10-08: qty 40

## 2020-10-08 MED ORDER — LACTATED RINGERS IV SOLN: INTRAVENOUS | Status: DC

## 2020-10-08 MED ORDER — FENTANYL CITRATE (PF) 100 MCG/2ML IJ SOLN
INTRAMUSCULAR | Status: AC
  Filled 2020-10-08: qty 2

## 2020-10-08 MED ORDER — FENTANYL CITRATE (PF) 100 MCG/2ML IJ SOLN
25.0000 ug | INTRAMUSCULAR | Status: DC | PRN
  Administered 2020-10-08: 25 ug via INTRAVENOUS
  Administered 2020-10-08: 50 ug via INTRAVENOUS

## 2020-10-08 MED ORDER — OXYCODONE HCL 5 MG/5ML PO SOLN: 5.0000 mg | Freq: Once | ORAL | Status: DC | PRN

## 2020-10-08 MED ORDER — FENTANYL CITRATE (PF) 100 MCG/2ML IJ SOLN
50.0000 ug | Freq: Once | INTRAMUSCULAR | Status: AC
  Administered 2020-10-08: 50 ug via INTRAVENOUS
  Filled 2020-10-08: qty 2

## 2020-10-08 MED ORDER — CHLORHEXIDINE GLUCONATE 4 % EX LIQD: 60.0000 mL | Freq: Once | CUTANEOUS | Status: DC

## 2020-10-08 MED ORDER — FENTANYL CITRATE (PF) 250 MCG/5ML IJ SOLN
INTRAMUSCULAR | Status: AC
  Filled 2020-10-08: qty 5

## 2020-10-08 MED ORDER — HYDROCODONE-ACETAMINOPHEN 5-325 MG PO TABS: 1.0000 | ORAL_TABLET | Freq: Three times a day (TID) | ORAL | 0 refills | Status: AC | PRN

## 2020-10-08 MED ORDER — TETANUS-DIPHTH-ACELL PERTUSSIS 5-2.5-18.5 LF-MCG/0.5 IM SUSY
0.5000 mL | PREFILLED_SYRINGE | Freq: Once | INTRAMUSCULAR | Status: AC
  Administered 2020-10-08: 0.5 mL via INTRAMUSCULAR
  Filled 2020-10-08: qty 0.5

## 2020-10-08 MED ORDER — CHLORHEXIDINE GLUCONATE 0.12 % MT SOLN
15.0000 mL | Freq: Once | OROMUCOSAL | Status: AC
Start: 2020-10-08 — End: 2020-10-08

## 2020-10-08 MED ORDER — PHENYLEPHRINE 40 MCG/ML (10ML) SYRINGE FOR IV PUSH (FOR BLOOD PRESSURE SUPPORT)
PREFILLED_SYRINGE | INTRAVENOUS | Status: AC
  Filled 2020-10-08: qty 10

## 2020-10-08 MED ORDER — DEXAMETHASONE SODIUM PHOSPHATE 10 MG/ML IJ SOLN
INTRAMUSCULAR | Status: DC | PRN
  Administered 2020-10-08: 5 mg via INTRAVENOUS

## 2020-10-08 MED ORDER — CEFAZOLIN SODIUM-DEXTROSE 2-4 GM/100ML-% IV SOLN
2.0000 g | Freq: Once | INTRAVENOUS | Status: AC
  Administered 2020-10-08: 2 g via INTRAVENOUS
  Filled 2020-10-08: qty 100

## 2020-10-08 MED ORDER — LIDOCAINE 2% (20 MG/ML) 5 ML SYRINGE
INTRAMUSCULAR | Status: AC
  Filled 2020-10-08: qty 5

## 2020-10-08 MED ORDER — ONDANSETRON HCL 4 MG/2ML IJ SOLN
INTRAMUSCULAR | Status: DC | PRN
  Administered 2020-10-08: 4 mg via INTRAVENOUS

## 2020-10-08 MED ORDER — PHENYLEPHRINE 40 MCG/ML (10ML) SYRINGE FOR IV PUSH (FOR BLOOD PRESSURE SUPPORT)
PREFILLED_SYRINGE | INTRAVENOUS | Status: DC | PRN
  Administered 2020-10-08 (×2): 80 ug via INTRAVENOUS

## 2020-10-08 MED ORDER — POVIDONE-IODINE 10 % EX SWAB: 2.0000 "application " | Freq: Once | CUTANEOUS | Status: DC

## 2020-10-08 MED ORDER — OXYCODONE HCL 5 MG PO TABS: 5.0000 mg | ORAL_TABLET | Freq: Once | ORAL | Status: DC | PRN

## 2020-10-08 MED ORDER — MORPHINE SULFATE (PF) 2 MG/ML IV SOLN
2.0000 mg | Freq: Once | INTRAVENOUS | Status: AC
  Administered 2020-10-08: 2 mg via INTRAVENOUS
  Filled 2020-10-08: qty 1

## 2020-10-08 MED ORDER — PROPOFOL 10 MG/ML IV BOLUS
INTRAVENOUS | Status: DC | PRN
  Administered 2020-10-08: 50 mg via INTRAVENOUS
  Administered 2020-10-08: 150 mg via INTRAVENOUS

## 2020-10-08 MED ORDER — BUPIVACAINE HCL (PF) 0.25 % IJ SOLN
INTRAMUSCULAR | Status: AC
  Filled 2020-10-08: qty 20

## 2020-10-08 MED ORDER — CEFAZOLIN SODIUM-DEXTROSE 2-4 GM/100ML-% IV SOLN
2.0000 g | INTRAVENOUS | Status: AC
  Administered 2020-10-08: 2 g via INTRAVENOUS
  Filled 2020-10-08: qty 100

## 2020-10-08 MED ORDER — DEXMEDETOMIDINE (PRECEDEX) IN NS 20 MCG/5ML (4 MCG/ML) IV SYRINGE
PREFILLED_SYRINGE | INTRAVENOUS | Status: DC | PRN
  Administered 2020-10-08: 8 ug via INTRAVENOUS

## 2020-10-08 MED ORDER — MIDAZOLAM HCL 2 MG/2ML IJ SOLN
INTRAMUSCULAR | Status: AC
  Filled 2020-10-08: qty 2

## 2020-10-08 MED ORDER — ONDANSETRON HCL 4 MG/2ML IJ SOLN
INTRAMUSCULAR | Status: AC
  Filled 2020-10-08: qty 2

## 2020-10-08 MED ORDER — DEXAMETHASONE SODIUM PHOSPHATE 10 MG/ML IJ SOLN
INTRAMUSCULAR | Status: AC
  Filled 2020-10-08: qty 1

## 2020-10-08 MED ORDER — BUPIVACAINE HCL (PF) 0.25 % IJ SOLN
INTRAMUSCULAR | Status: DC | PRN
  Administered 2020-10-08: 10 mL

## 2020-10-08 MED ORDER — SODIUM CHLORIDE 0.9 % IR SOLN
Status: DC | PRN
  Administered 2020-10-08: 1000 mL

## 2020-10-08 MED ORDER — LIDOCAINE 2% (20 MG/ML) 5 ML SYRINGE
INTRAMUSCULAR | Status: DC | PRN
  Administered 2020-10-08: 80 mg via INTRAVENOUS

## 2020-10-08 MED ORDER — CHLORHEXIDINE GLUCONATE 0.12 % MT SOLN
OROMUCOSAL | Status: AC
  Administered 2020-10-08: 15 mL via OROMUCOSAL
  Filled 2020-10-08: qty 15

## 2020-10-08 MED ORDER — ONDANSETRON HCL 4 MG/2ML IJ SOLN: 4.0000 mg | Freq: Once | INTRAMUSCULAR | Status: DC | PRN

## 2020-10-08 MED ORDER — FENTANYL CITRATE (PF) 250 MCG/5ML IJ SOLN
INTRAMUSCULAR | Status: DC | PRN
Start: 2020-10-08 — End: 2020-10-08
  Administered 2020-10-08: 50 ug via INTRAVENOUS
  Administered 2020-10-08: 25 ug via INTRAVENOUS

## 2020-10-08 SURGICAL SUPPLY — 74 items
BNDG COHESIVE 1X5 TAN STRL LF (GAUZE/BANDAGES/DRESSINGS) IMPLANT
BNDG COHESIVE 2X5 TAN STRL LF (GAUZE/BANDAGES/DRESSINGS) IMPLANT
BNDG COHESIVE 4X5 TAN STRL (GAUZE/BANDAGES/DRESSINGS) IMPLANT
BNDG CONFORM 2 STRL LF (GAUZE/BANDAGES/DRESSINGS) IMPLANT
BNDG ELASTIC 3X5.8 VLCR STR LF (GAUZE/BANDAGES/DRESSINGS) ×3 IMPLANT
BNDG ELASTIC 4X5.8 VLCR STR LF (GAUZE/BANDAGES/DRESSINGS) ×3 IMPLANT
BNDG ESMARK 4X9 LF (GAUZE/BANDAGES/DRESSINGS) IMPLANT
BNDG GAUZE ELAST 4 BULKY (GAUZE/BANDAGES/DRESSINGS) ×6 IMPLANT
CLOSURE WOUND 1/2 X4 (GAUZE/BANDAGES/DRESSINGS) ×1
CORD BIPOLAR FORCEPS 12FT (ELECTRODE) IMPLANT
COVER SURGICAL LIGHT HANDLE (MISCELLANEOUS) ×3 IMPLANT
COVER WAND RF STERILE (DRAPES) ×3 IMPLANT
CUFF TOURN SGL QUICK 18X4 (TOURNIQUET CUFF) IMPLANT
CUFF TOURN SGL QUICK 24 (TOURNIQUET CUFF)
CUFF TRNQT CYL 24X4X16.5-23 (TOURNIQUET CUFF) IMPLANT
DRAPE INCISE IOBAN 66X45 STRL (DRAPES) IMPLANT
DRAPE SURG 17X23 STRL (DRAPES) ×3 IMPLANT
DRSG ADAPTIC 3X8 NADH LF (GAUZE/BANDAGES/DRESSINGS) IMPLANT
DRSG EMULSION OIL 3X3 NADH (GAUZE/BANDAGES/DRESSINGS) ×3 IMPLANT
DRSG XEROFORM 1X8 (GAUZE/BANDAGES/DRESSINGS) ×3 IMPLANT
GAUZE SPONGE 4X4 12PLY STRL (GAUZE/BANDAGES/DRESSINGS) ×3 IMPLANT
GAUZE SPONGE 4X4 12PLY STRL LF (GAUZE/BANDAGES/DRESSINGS) ×3 IMPLANT
GAUZE XEROFORM 1X8 LF (GAUZE/BANDAGES/DRESSINGS) ×3 IMPLANT
GLOVE BIO SURGEON STRL SZ 6.5 (GLOVE) ×2 IMPLANT
GLOVE BIO SURGEON STRL SZ7.5 (GLOVE) ×3 IMPLANT
GLOVE BIO SURGEON STRL SZ8 (GLOVE) ×3 IMPLANT
GLOVE BIO SURGEONS STRL SZ 6.5 (GLOVE) ×1
GLOVE BIOGEL M STRL SZ7.5 (GLOVE) ×3 IMPLANT
GLOVE BIOGEL PI IND STRL 7.0 (GLOVE) ×1 IMPLANT
GLOVE BIOGEL PI IND STRL 8 (GLOVE) ×1 IMPLANT
GLOVE BIOGEL PI INDICATOR 7.0 (GLOVE) ×2
GLOVE BIOGEL PI INDICATOR 8 (GLOVE) ×2
GOWN STRL REUS W/ TWL LRG LVL3 (GOWN DISPOSABLE) ×2 IMPLANT
GOWN STRL REUS W/ TWL XL LVL3 (GOWN DISPOSABLE) IMPLANT
GOWN STRL REUS W/TWL LRG LVL3 (GOWN DISPOSABLE) ×6
GOWN STRL REUS W/TWL XL LVL3 (GOWN DISPOSABLE)
K-WIRE DBL TROCAR .035X4 (WIRE) ×9
K-WIRE DBL TROCAR .045X4 (WIRE)
KIT BASIN OR (CUSTOM PROCEDURE TRAY) ×3 IMPLANT
KIT TURNOVER KIT B (KITS) ×3 IMPLANT
KWIRE DBL TROCAR .035X4 (WIRE) ×3 IMPLANT
KWIRE DBL TROCAR .045X4 (WIRE) IMPLANT
MANIFOLD NEPTUNE II (INSTRUMENTS) ×3 IMPLANT
NEEDLE HYPO 22GX1.5 SAFETY (NEEDLE) IMPLANT
NEEDLE HYPO 25GX1X1/2 BEV (NEEDLE) IMPLANT
NS IRRIG 1000ML POUR BTL (IV SOLUTION) ×3 IMPLANT
PACK ORTHO EXTREMITY (CUSTOM PROCEDURE TRAY) ×3 IMPLANT
PAD ABD 8X10 STRL (GAUZE/BANDAGES/DRESSINGS) IMPLANT
PAD ARMBOARD 7.5X6 YLW CONV (MISCELLANEOUS) ×3 IMPLANT
PAD CAST 4YDX4 CTTN HI CHSV (CAST SUPPLIES) IMPLANT
PADDING CAST COTTON 4X4 STRL (CAST SUPPLIES)
SET CYSTO W/LG BORE CLAMP LF (SET/KITS/TRAYS/PACK) IMPLANT
SOL PREP POV-IOD 4OZ 10% (MISCELLANEOUS) ×6 IMPLANT
SPLINT FIBERGLASS 3X12 (CAST SUPPLIES) ×3 IMPLANT
SPONGE LAP 4X18 RFD (DISPOSABLE) ×3 IMPLANT
STAPLER VISISTAT 35W (STAPLE) IMPLANT
STRIP CLOSURE SKIN 1/2X4 (GAUZE/BANDAGES/DRESSINGS) ×2 IMPLANT
SUT CHROMIC 4 0 PS 2 18 (SUTURE) IMPLANT
SUT CHROMIC 6 0 PS 4 (SUTURE) IMPLANT
SUT ETHILON 4 0 PS 2 18 (SUTURE) ×3 IMPLANT
SUT VIC AB 4-0 PS2 18 (SUTURE) IMPLANT
SUT VICRYL RAPIDE 4/0 PS 2 (SUTURE) IMPLANT
SWAB CULTURE ESWAB REG 1ML (MISCELLANEOUS) IMPLANT
SYR 10ML LL (SYRINGE) IMPLANT
SYR BULB EAR ULCER 3OZ GRN STR (SYRINGE) IMPLANT
SYR CONTROL 10ML LL (SYRINGE) IMPLANT
TOWEL GREEN STERILE (TOWEL DISPOSABLE) ×3 IMPLANT
TOWEL GREEN STERILE FF (TOWEL DISPOSABLE) ×3 IMPLANT
TOWEL OR NON WOVEN STRL DISP B (DISPOSABLE) IMPLANT
TUBE CONNECTING 12'X1/4 (SUCTIONS) ×1
TUBE CONNECTING 12X1/4 (SUCTIONS) ×2 IMPLANT
UNDERPAD 30X36 HEAVY ABSORB (UNDERPADS AND DIAPERS) ×3 IMPLANT
WATER STERILE IRR 1000ML POUR (IV SOLUTION) IMPLANT
YANKAUER SUCT BULB TIP NO VENT (SUCTIONS) ×3 IMPLANT

## 2020-10-08 NOTE — Interval H&P Note (Signed)
History and Physical Interval Note:  10/08/2020 4:13 PM  Daniel Compton  has presented today for surgery, with the diagnosis of Right ring finger proximal phalanx fx.  The various methods of treatment have been discussed with the patient and family. After consideration of risks, benefits and other options for treatment, the patient has consented to  Procedure(s): PERCUTANEOUS PINNING EXTREMITY (Right) OPEN REDUCTION INTERNAL FIXATION (ORIF) PROXIMAL PHALANX (Right) as a surgical intervention.  The patient's history has been reviewed, patient examined, no change in status, stable for surgery.  I have reviewed the patient's chart and labs.  Questions were answered to the patient's satisfaction.     Allena Napoleon
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
## 2020-10-08 NOTE — Op Note (Signed)
Operative Note   DATE OF OPERATION: 10/08/2020  SURGICAL DEPARTMENT: Plastic Surgery  PREOPERATIVE DIAGNOSES: 1.  Right ring finger proximal phalanx fracture comminuted and displaced 2.  Palm lacerations totaling 3 cm in length  POSTOPERATIVE DIAGNOSES:  same  PROCEDURE: 1.  Closed reduction percutaneous pinning of right ring finger proximal phalanx fracture 2.  Simple closure of palm lacerations totaling 3 cm in length  SURGEON: Ancil Linsey, MD  ASSISTANT: Lorin Picket, PA The advanced practice practitioner (APP) assisted throughout the case.  The APP was essential in retraction and counter traction when needed to make the case progress smoothly.  This retraction and assistance made it possible to see the tissue plans for the procedure.  The assistance was needed for blood control, tissue re-approximation and assisted with closure of the incision site.  ANESTHESIA:  General.   COMPLICATIONS: None.   INDICATIONS FOR PROCEDURE:  The patient, Daniel Compton is a 74 y.o. male born on September 19, 1946, is here for treatment of comminuted displaced right ring finger proximal phalanx fracture and palm lacerations. MRN: [id number]  CONSENT:  Informed consent was obtained directly from the patient. Risks, benefits and alternatives were fully discussed. Specific risks including but not limited to bleeding, infection, hematoma, seroma, scarring, pain, contracture, asymmetry, wound healing problems, and need for further surgery were all discussed. The patient did have an ample opportunity to have questions answered to satisfaction.   DESCRIPTION OF PROCEDURE:  The patient was taken to the operating room. SCDs were placed and Ancef antibiotics were given.  General anesthesia was administered.  The patient's operative site was prepped and draped in a sterile fashion. A time out was performed and all information was confirmed to be correct.  Started by examining fracture.  This was a comminuted  fracture with a splintered dorsal component and the most volar cortex was also disrupted.  I was able to largely reduce this with a bone clamp.  3 .035 K wires were used to stabilize this.  The bone clamp was removed and the reduction was maintained.  He did not have any rotational deformity at this point.  I then turned my attention to the palm.  The palm was irrigated copiously with saline.  Simple closure was done with interrupted 4-0 chromic sutures.  These were 2 separate lacerations one at the base of the ring finger and one at the base of the long finger approaching the MP flexion crease.  Reduction and hardware location were confirmed with fluoroscopy and the images were saved.  A ulnar gutter type splint was then applied.  Marcaine was used for local anesthesia.  The patient tolerated the procedure well.  There were no complications. The patient was allowed to wake from anesthesia, extubated and taken to the recovery room in satisfactory condition.

## 2020-10-08 NOTE — ED Provider Notes (Signed)
[facility] EMERGENCY DEPARTMENT Provider Note   CSN: [id number] Arrival date & time: 10/08/20  0857     History Chief Complaint  Patient presents with   Extremity Laceration    Daniel Compton is a 74 y.o. male presents today with injury of the right hand.  He was working on his truck today and removing the jack when it slipped crushing his right hand causing a laceration.  He reports immediate pain throbbing sharp constant nonradiating worse with movement and palpation improved with rest.  He controlled bleeding immediately with direct pressure.  He reports pain primarily of to his third and fourth knuckles and the same fingers.  He denies any head injury, loss of consciousness, neck pain, back pain, chest pain, abdominal pain, pain of the lower extremities, pain of the left upper extremity, pain of the shoulder elbow forearm or wrist or any additional concerns.  HPI     Past Medical History:  Diagnosis Date   GERD (gastroesophageal reflux disease)    Hypertension     There are no problems to display for this patient.   History reviewed. No pertinent surgical history.     History reviewed. No pertinent family history.  Social History   Tobacco Use   Smoking status: Former Smoker    Quit date: 06/30/2012    Years since quitting: 8.2  Substance Use Topics   Alcohol use: Yes    Comment: occassional   Drug use: No    Home Medications Prior to Admission medications   Medication Sig Start Date End Date Taking? Authorizing Provider  amLODipine (NORVASC) 10 MG tablet Take 5 mg by mouth daily.    [provider]  ascorbic acid (VITAMIN C) 500 MG tablet Take 500 mg by mouth 3 (three) times daily with meals.    [provider]  HYDROcodone-acetaminophen (NORCO/VICODIN) 5-325 MG tablet Take 1-2 tablets by mouth every 6 (six) hours as needed. Patient not taking: Reported on 09/12/2018 05/19/16   Roxy Horseman, PA-C  Multiple Vitamin  (DAILY VITE) TABS Take 1 tablet by mouth daily.    [provider]  ranitidine (ZANTAC) 150 MG tablet Take 150 mg by mouth 2 (two) times daily.    [provider]  senna-docusate (SENOKOT-S) 8.6-50 MG per tablet Take 2 tablets by mouth 2 (two) times daily.    [provider]  tamsulosin (FLOMAX) 0.4 MG CAPS capsule Take 0.4 mg by mouth at bedtime.    [provider]    Allergies    Patient has no known allergies.  Review of Systems   Review of Systems Ten systems are reviewed and are negative for acute change except as noted in the HPI  Physical Exam Updated Vital Signs BP (!) 145/105 (BP Location: Right Arm)    Pulse 93    Temp 98 F (36.7 C) (Oral)    Resp 18    Ht 5\' 9"  (1.753 m)    Wt 74.8 kg    SpO2 96%    BMI 24.37 kg/m   Physical Exam Constitutional:      General: He is not in acute distress.    Appearance: Normal appearance. He is well-developed. He is not ill-appearing or diaphoretic.  HENT:     Head: Normocephalic and atraumatic.     Jaw: There is normal jaw occlusion.  Eyes:     General: Vision grossly intact. Gaze aligned appropriately.     Extraocular Movements: Extraocular movements intact.  Pupils: Pupils are equal, round, and reactive to light.  Neck:     Trachea: Trachea and phonation normal. No tracheal tenderness or tracheal deviation.  Cardiovascular:     Rate and Rhythm: Normal rate and regular rhythm.     Pulses:          Radial pulses are 2+ on the right side and 2+ on the left side.  Pulmonary:     Effort: Pulmonary effort is normal. No respiratory distress.     Breath sounds: Normal air entry.  Chest:     Chest wall: No deformity, tenderness or crepitus.     Comments: No sign of injury Abdominal:     General: There is no distension.     Palpations: Abdomen is soft.     Tenderness: There is no abdominal tenderness. There is no guarding or rebound.     Comments: No sign of injury  Musculoskeletal:         General: Normal range of motion.     Cervical back: Normal range of motion. No spinous process tenderness or muscular tenderness.     Comments: No midline C/T/L spinal tenderness to palpation, no paraspinal muscle tenderness, no deformity, crepitus, or step-off noted. No sign of injury to the neck or back.  Pelvis stable to compression bilateral without pain.  Patient able to bring bilateral knees to chest without pain.  Appropriate range of motion and strength of all major joints of the bilateral lower extremities intact for age.  All motions left upper extremity intact without pain. - Right shoulder and right elbow with proper range of motion and strength for age without pain.  No pain with motion at the right wrist.  Patient with pain of the entire right ring finger as well as the right middle fingers.  Associated lacerations at the base of each finger are present without obvious osseous involvement.  Flexion extension is intact at all joints with increased pain.  Capillary fill and sensation intact to all fingers.  Compartments soft.  Thumb opposition intact to all fingers.  Radial pulses intact and equal bilaterally.  Skin:    General: Skin is warm and dry.  Neurological:     Mental Status: He is alert.     GCS: GCS eye subscore is 4. GCS verbal subscore is 5. GCS motor subscore is 6.     Comments: Speech is clear and goal oriented, follows commands Major Cranial nerves without deficit, no facial droop Moves extremities without ataxia, coordination intact  Psychiatric:        Behavior: Behavior normal.        ED Results / Procedures / Treatments   Labs (all labs ordered are listed, but only abnormal results are displayed) Labs Reviewed  RESPIRATORY PANEL BY RT PCR (FLU A&B, COVID)    EKG None  Radiology DG Hand Complete Right  Result Date: 10/08/2020 CLINICAL DATA:  Pt working on a truck that was jacked up, fell down on his right hand - pt states he had a ring on right ring  finger and that helped block some of the blow - lacerations and bruising to right 2nd, 3rd and 4th fingers EXAM: RIGHT HAND - COMPLETE 3+ VIEW COMPARISON:  None. FINDINGS: Comminuted fracture of the proximal phalanx of the fourth finger, extending from the ulnar base to the the head, but not involving the articular surfaces of the MCP or PIP joints. Primary fracture component is mildly displaced in a dorsal ulnar direction by 4 mm.  No other fractures. No dislocation. There are arthropathic changes with asymmetric joint space narrowing, mild subchondral sclerosis and marginal osteophytes most evident at the first MCP and the thumb IP joints, and the third finger D IP joint, consistent with osteoarthritis. There is significant soft tissue swelling over the dorsal hand at the MCP joint level. IMPRESSION: 1. Comminuted fracture of the proximal phalanx of the fourth finger as described. No dislocation. Electronically Signed   By: Amie Portland M.D.   On: 10/08/2020 09:46    Procedures Procedures (including critical care time)  Medications Ordered in ED Medications  morphine 2 MG/ML injection 2 mg (has no administration in time range)  fentaNYL (SUBLIMAZE) injection 50 mcg (50 mcg Intravenous Given 10/08/20 0953)  Tdap (BOOSTRIX) injection 0.5 mL (0.5 mLs Intramuscular Given 10/08/20 1025)  ceFAZolin (ANCEF) IVPB 2g/100 mL premix (2 g Intravenous New Bag/Given 10/08/20 1051)    ED Course  I have reviewed the triage vital signs and the nursing notes.  Pertinent labs & imaging results that were available during my care of the patient were reviewed by me and considered in my medical decision making (see chart for details).  Clinical Course as of Oct 08 1125  Thu Oct 08, 2020  [id number] Margaretha Glassing   [BM]  1018 Volar Splint to DIP.   [BM]    Clinical Course User Index [BM] Elizabeth Palau   MDM Rules/Calculators/A&P                         Additional history obtained from: 1. Nursing notes  from this visit. 2. Review of prior medical records.  No pertinent recent ED visits. ------------ 74 year old male presented today with injury of the right hand after a jack slipped when he was loading his truck down.  He has injuries of the right index and right middle finger with lacerations at the base of each finger.  Neurovascularly intact.  No evidence of injury at the wrist elbow or shoulder of the right upper extremity.  No evidence of head neck back chest abdomen pelvic lower extremity or left upper extremity injury.  Right hand x-ray is obtained no negation further imaging at this time.  Pain medication ordered. - DG Right Hand:  IMPRESSION:  1. Comminuted fracture of the proximal phalanx of the fourth finger  as described. No dislocation.  I have personally reviewed patient's right hand x-ray and agree with radiologist interpretation above.   Patient was seen and evaluated by hand surgery Earney Hamburg, PA-C who discussed case with the Dr. Arita Miss.  They plan to take patient to the OR this afternoon for repair.  2 g Ancef ordered.  Screening Covid test ordered. - Patient reassessed he is resting comfortably no acute distress tachycardia improved following pain control, he has no complaints or concerns at this time.  Note: Portions of this report may have been transcribed using voice recognition software. Every effort was made to ensure accuracy; however, inadvertent computerized transcription errors may still be present. Final Clinical Impression(s) / ED Diagnoses Final diagnoses:  Laceration of finger of right hand without foreign body without damage to nail, unspecified finger, initial encounter  Open nondisplaced fracture of proximal phalanx of right ring finger, initial encounter    Rx / DC Orders ED Discharge Orders    None       Elizabeth Palau 10/08/20 1126    Pricilla Loveless, MD 10/10/20 [phone]

## 2020-10-08 NOTE — Anesthesia Procedure Notes (Addendum)
Procedure Name: LMA Insertion Date/Time: 10/08/2020 4:32 PM Performed by: Aundria Rud, CRNA Pre-anesthesia Checklist: Patient identified, Emergency Drugs available, Suction available and Patient being monitored Patient Re-evaluated:Patient Re-evaluated prior to induction Oxygen Delivery Method: Circle System Utilized Preoxygenation: Pre-oxygenation with 100% oxygen Induction Type: IV induction Ventilation: Mask ventilation without difficulty LMA: LMA inserted LMA Size: 4.0 Number of attempts: 1 Placement Confirmation: positive ETCO2 Tube secured with: Tape Dental Injury: Teeth and Oropharynx as per pre-operative assessment

## 2020-10-08 NOTE — Brief Op Note (Signed)
10/08/2020  5:32 PM  PATIENT:  Daniel Compton  74 y.o. male  PRE-OPERATIVE DIAGNOSIS:  Right ring finger proximal phalanx fx  POST-OPERATIVE DIAGNOSIS:  Right ring finger proximal phalanx fx  PROCEDURE:  Procedure(s): PERCUTANEOUS PINNING EXTREMITY (Right)  SURGEON:  Surgeon(s) and Role:    * Curry Seefeldt, Wendy Poet, MD - Primary  PHYSICIAN ASSISTANT: Joni Fears, PA  ASSISTANTS: none   ANESTHESIA:   general  EBL:  5   BLOOD ADMINISTERED:none  DRAINS: none   LOCAL MEDICATIONS USED:  MARCAINE     SPECIMEN:  No Specimen  DISPOSITION OF SPECIMEN:  N/A  COUNTS:  YES  TOURNIQUET:  * Missing tourniquet times found for documented tourniquets in log: [id number] *  DICTATION: .Reubin Milan Dictation  PLAN OF CARE: Discharge to home after PACU  PATIENT DISPOSITION:  PACU - hemodynamically stable.   Delay start of Pharmacological VTE agent (>24hrs) due to surgical blood loss or risk of bleeding: not applicable

## 2020-10-08 NOTE — Anesthesia Preprocedure Evaluation (Addendum)
Anesthesia Evaluation  Patient identified by MRN, date of birth, ID band Patient awake    Reviewed: Allergy & Precautions, H&P , NPO status , Patient's Chart, lab work & pertinent test results, reviewed documented beta blocker date and time   Airway Mallampati: I  TM Distance: >3 FB Neck ROM: Full    Dental no notable dental hx. (+) Edentulous Upper, Edentulous Lower   Pulmonary neg pulmonary ROS, former smoker,    Pulmonary exam normal breath sounds clear to auscultation       Cardiovascular Exercise Tolerance: Good hypertension, Pt. on medications negative cardio ROS Normal cardiovascular exam Rhythm:Regular Rate:Normal     Neuro/Psych negative neurological ROS  negative psych ROS   GI/Hepatic negative GI ROS, Neg liver ROS, GERD  Medicated,  Endo/Other  negative endocrine ROS  Renal/GU negative Renal ROS  negative genitourinary   Musculoskeletal negative musculoskeletal ROS (+)   Abdominal   Peds negative pediatric ROS (+)  Hematology negative hematology ROS (+) anemia ,   Anesthesia Other Findings   Reproductive/Obstetrics negative OB ROS                          Anesthesia Physical Anesthesia Plan  ASA: III and emergent  Anesthesia Plan: General   Post-op Pain Management: GA combined w/ Regional for post-op pain   Induction:   PONV Risk Score and Plan: 2 and Ondansetron, Dexamethasone and Treatment may vary due to age or medical condition  Airway Management Planned: Oral ETT and LMA  Additional Equipment:   Intra-op Plan:   Post-operative Plan:   Informed Consent: I have reviewed the patients History and Physical, chart, labs and discussed the procedure including the risks, benefits and alternatives for the proposed anesthesia with the patient or authorized representative who has indicated his/her understanding and acceptance.     Dental Advisory Given  Plan  Discussed with: CRNA and Anesthesiologist  Anesthesia Plan Comments:        Anesthesia Quick Evaluation

## 2020-10-08 NOTE — Consult Note (Signed)
Reason for Consult:Right 4th prox phalanx fx Referring Physician: S Goldston Time called: 0956 Time at bedside: 1016  Daniel Compton is an 74 y.o. male.  HPI: Daniel Compton was working on a car up on a jack when it fell and crushed his right hand. He had immediate pain and some lacerations on his palm. He came to the ED for evaluation. X-rays showed a 4th proximal phalanx fx that was potentially open and hand surgery was consulted. He is RHD and retired.  Past Medical History:  Diagnosis Date  . GERD (gastroesophageal reflux disease)   . Hypertension     History reviewed. No pertinent surgical history.  History reviewed. No pertinent family history.  Social History:  reports that he quit smoking about 8 years ago. He does not have any smokeless tobacco history on file. He reports current alcohol use. He reports that he does not use drugs.  Allergies: No Known Allergies  Medications: I have reviewed the patient's current medications.  No results found for this or any previous visit (from the past 48 hour(s)).  DG Hand Complete Right  Result Date: 10/08/2020 CLINICAL DATA:  Pt working on a truck that was jacked up, fell down on his right hand - pt states he had a ring on right ring finger and that helped block some of the blow - lacerations and bruising to right 2nd, 3rd and 4th fingers EXAM: RIGHT HAND - COMPLETE 3+ VIEW COMPARISON:  None. FINDINGS: Comminuted fracture of the proximal phalanx of the fourth finger, extending from the ulnar base to the the head, but not involving the articular surfaces of the MCP or PIP joints. Primary fracture component is mildly displaced in a dorsal ulnar direction by 4 mm. No other fractures. No dislocation. There are arthropathic changes with asymmetric joint space narrowing, mild subchondral sclerosis and marginal osteophytes most evident at the first MCP and the thumb IP joints, and the third finger D IP joint, consistent with osteoarthritis. There is  significant soft tissue swelling over the dorsal hand at the MCP joint level. IMPRESSION: 1. Comminuted fracture of the proximal phalanx of the fourth finger as described. No dislocation. Electronically Signed   By: David  Ormond M.D.   On: 10/08/2020 09:46    Review of Systems  HENT: Negative for ear discharge, ear pain, hearing loss and tinnitus.   Eyes: Negative for photophobia and pain.  Respiratory: Negative for cough and shortness of breath.   Cardiovascular: Negative for chest pain.  Gastrointestinal: Negative for abdominal pain, nausea and vomiting.  Genitourinary: Negative for dysuria, flank pain, frequency and urgency.  Musculoskeletal: Positive for arthralgias (Right 3rd,4th fingers). Negative for back pain, myalgias and neck pain.  Neurological: Negative for dizziness and headaches.  Hematological: Does not bruise/bleed easily.  Psychiatric/Behavioral: The patient is not nervous/anxious.    Blood pressure (!) 162/117, pulse (!) 125, temperature 97.7 F (36.5 C), temperature source Oral, resp. rate 20, height 5' 9" (1.753 m), weight 74.8 kg, SpO2 97 %. Physical Exam Constitutional:      General: He is not in acute distress.    Appearance: He is well-developed. He is not diaphoretic.  HENT:     Head: Normocephalic and atraumatic.  Eyes:     General: No scleral icterus.       Right eye: No discharge.        Left eye: No discharge.     Conjunctiva/sclera: Conjunctivae normal.  Cardiovascular:     Rate and Rhythm: Normal rate and regular   rhythm.  Pulmonary:     Effort: Pulmonary effort is normal. No respiratory distress.  Musculoskeletal:     Cervical back: Normal range of motion.     Comments: Right shoulder, elbow, wrist, digits- Small lacerations palmar prox to 3rd, 4th MCP joint crease, mod TTP, no instability, no blocks to motion  Sens  Ax/R/M/U intact  Mot   Ax/ R/ PIN/ M/ AIN/ U intact  Rad 2+  Skin:    General: Skin is warm and dry.  Neurological:     Mental  Status: He is alert.  Psychiatric:        Behavior: Behavior normal.     Assessment/Plan: Right hand injury -- Plan CRPP vs ORIF this afternoon by Dr. Pace. Please keep NPO.    Quadre Bristol J. Mayari Matus, PA-C Orthopedic Surgery [phone] 10/08/2020, 10:22 AM  

## 2020-10-08 NOTE — Discharge Instructions (Signed)
No heavy activities. Elevate arm to reduce swelling.  Diet: Regular  Wound Care: Keep dressing clean & dry.  Do not get splint wet.  You can shower but make sure to place a bag over the splint with a good seal to keep it dry.  Do not remove dressing.  Call doctor if any unusual problems occur such as pain, excessive bleeding, unrelieved nausea/vomiting, fever &/or chills.  Follow-up appointment: Should be seen by hand therapist for splint change in 1 week.  Appointment with Dr. Arita Miss. In 2 weeks with x-ray prior to visit.

## 2020-10-08 NOTE — Transfer of Care (Signed)
Immediate Anesthesia Transfer of Care Note  Patient: Daniel Compton  Procedure(s) Performed: PERCUTANEOUS PINNING EXTREMITY (Right )  Patient Location: PACU  Anesthesia Type:General  Level of Consciousness: awake, alert , oriented and drowsy  Airway & Oxygen Therapy: Patient Spontanous Breathing  Post-op Assessment: Report given to RN and Post -op Vital signs reviewed and stable  Post vital signs: Reviewed and stable  Last Vitals:  Vitals Value Taken Time  BP 134/103 10/08/20 1737  Temp    Pulse 101 10/08/20 1737  Resp 12 10/08/20 1737  SpO2 99 % 10/08/20 1737  Vitals shown include unvalidated device data.  Last Pain:  Vitals:   10/08/20 1448  TempSrc:   PainSc: 4          Complications: No complications documented.

## 2020-10-08 NOTE — H&P (View-Only) (Signed)
Reason for Consult:Right 4th prox phalanx fx Referring Physician: Alric Ran Time called: 2355 Time at bedside: 1016  Daniel Compton is an 74 y.o. male.  HPI: Daniel Compton was working on a car up on a jack when it fell and crushed his right hand. He had immediate pain and some lacerations on his palm. He came to the ED for evaluation. X-rays showed a 4th proximal phalanx fx that was potentially open and hand surgery was consulted. He is RHD and retired.  Past Medical History:  Diagnosis Date  . GERD (gastroesophageal reflux disease)   . Hypertension     History reviewed. No pertinent surgical history.  History reviewed. No pertinent family history.  Social History:  reports that he quit smoking about 8 years ago. He does not have any smokeless tobacco history on file. He reports current alcohol use. He reports that he does not use drugs.  Allergies: No Known Allergies  Medications: I have reviewed the patient's current medications.  No results found for this or any previous visit (from the past 48 hour(s)).  DG Hand Complete Right  Result Date: 10/08/2020 CLINICAL DATA:  Pt working on a truck that was jacked up, fell down on his right hand - pt states he had a ring on right ring finger and that helped block some of the blow - lacerations and bruising to right 2nd, 3rd and 4th fingers EXAM: RIGHT HAND - COMPLETE 3+ VIEW COMPARISON:  None. FINDINGS: Comminuted fracture of the proximal phalanx of the fourth finger, extending from the ulnar base to the the head, but not involving the articular surfaces of the MCP or PIP joints. Primary fracture component is mildly displaced in a dorsal ulnar direction by 4 mm. No other fractures. No dislocation. There are arthropathic changes with asymmetric joint space narrowing, mild subchondral sclerosis and marginal osteophytes most evident at the first MCP and the thumb IP joints, and the third finger D IP joint, consistent with osteoarthritis. There is  significant soft tissue swelling over the dorsal hand at the MCP joint level. IMPRESSION: 1. Comminuted fracture of the proximal phalanx of the fourth finger as described. No dislocation. Electronically Signed   By: Amie Portland M.D.   On: 10/08/2020 09:46    Review of Systems  HENT: Negative for ear discharge, ear pain, hearing loss and tinnitus.   Eyes: Negative for photophobia and pain.  Respiratory: Negative for cough and shortness of breath.   Cardiovascular: Negative for chest pain.  Gastrointestinal: Negative for abdominal pain, nausea and vomiting.  Genitourinary: Negative for dysuria, flank pain, frequency and urgency.  Musculoskeletal: Positive for arthralgias (Right 3rd,4th fingers). Negative for back pain, myalgias and neck pain.  Neurological: Negative for dizziness and headaches.  Hematological: Does not bruise/bleed easily.  Psychiatric/Behavioral: The patient is not nervous/anxious.    Blood pressure (!) 162/117, pulse (!) 125, temperature 97.7 F (36.5 C), temperature source Oral, resp. rate 20, height 5\' 9"  (1.753 m), weight 74.8 kg, SpO2 97 %. Physical Exam Constitutional:      General: He is not in acute distress.    Appearance: He is well-developed. He is not diaphoretic.  HENT:     Head: Normocephalic and atraumatic.  Eyes:     General: No scleral icterus.       Right eye: No discharge.        Left eye: No discharge.     Conjunctiva/sclera: Conjunctivae normal.  Cardiovascular:     Rate and Rhythm: Normal rate and regular  rhythm.  Pulmonary:     Effort: Pulmonary effort is normal. No respiratory distress.  Musculoskeletal:     Cervical back: Normal range of motion.     Comments: Right shoulder, elbow, wrist, digits- Small lacerations palmar prox to 3rd, 4th MCP joint crease, mod TTP, no instability, no blocks to motion  Sens  Ax/R/M/U intact  Mot   Ax/ R/ PIN/ M/ AIN/ U intact  Rad 2+  Skin:    General: Skin is warm and dry.  Neurological:     Mental  Status: He is alert.  Psychiatric:        Behavior: Behavior normal.     Assessment/Plan: Right hand injury -- Plan CRPP vs ORIF this afternoon by Dr. Arita Miss. Please keep NPO.    Freeman Caldron, PA-C Orthopedic Surgery [phone] 10/08/2020, 10:22 AM

## 2020-10-09 ENCOUNTER — Encounter (HOSPITAL_COMMUNITY): Payer: Self-pay | Admitting: Plastic Surgery

## 2020-10-09 NOTE — Anesthesia Postprocedure Evaluation (Signed)
Anesthesia Post Note  Patient: Daniel Compton  Procedure(s) Performed: PERCUTANEOUS PINNING EXTREMITY (Right )     Patient location during evaluation: PACU Anesthesia Type: General Level of consciousness: awake and alert Pain management: pain level controlled Vital Signs Assessment: post-procedure vital signs reviewed and stable Respiratory status: spontaneous breathing, nonlabored ventilation, respiratory function stable and patient connected to nasal cannula oxygen Cardiovascular status: blood pressure returned to baseline and stable Postop Assessment: no apparent nausea or vomiting Anesthetic complications: no   No complications documented.  Last Vitals:  Vitals:   10/08/20 1825 10/08/20 1840  BP: (!) 140/96 (!) 146/98  Pulse: 95 99  Resp: 11 15  Temp:  36.6 C  SpO2: (!) 89% 90%    Last Pain:  Vitals:   10/08/20 1840  TempSrc:   PainSc: Asleep                 Lawayne Hartig COKER

## 2020-10-12 ENCOUNTER — Telehealth: Payer: Self-pay | Admitting: *Deleted

## 2020-10-12 NOTE — Telephone Encounter (Signed)
Patient called to get the telephone number for Dr Arita Miss to make a follow up appointment.

## 2020-10-19 ENCOUNTER — Other Ambulatory Visit: Payer: Self-pay

## 2020-10-19 ENCOUNTER — Ambulatory Visit (HOSPITAL_COMMUNITY)
Admission: RE | Admit: 2020-10-19 | Discharge: 2020-10-19 | Disposition: A | Discharge status: Home / Self Care | Payer: No Typology Code available for payment source | Source: Ambulatory Visit | Attending: Plastic Surgery | Admitting: Plastic Surgery

## 2020-10-19 ENCOUNTER — Encounter: Payer: Self-pay | Admitting: Plastic Surgery

## 2020-10-19 DIAGNOSIS — S62614A Displaced fracture of proximal phalanx of right ring finger, initial encounter for closed fracture: Secondary | ICD-10-CM | POA: Insufficient documentation

## 2020-10-21 ENCOUNTER — Other Ambulatory Visit: Payer: Self-pay

## 2020-10-21 ENCOUNTER — Encounter: Payer: Self-pay | Admitting: Plastic Surgery

## 2020-10-21 ENCOUNTER — Ambulatory Visit (INDEPENDENT_AMBULATORY_CARE_PROVIDER_SITE_OTHER): Payer: No Typology Code available for payment source | Admitting: Plastic Surgery

## 2020-10-21 ENCOUNTER — Ambulatory Visit: Payer: No Typology Code available for payment source | Attending: Plastic Surgery | Admitting: Occupational Therapy

## 2020-10-21 VITALS — BP 154/91 | HR 112 | Temp 98.2°F | Ht 69.0 in | Wt 178.0 lb

## 2020-10-21 DIAGNOSIS — M6281 Muscle weakness (generalized): Secondary | ICD-10-CM

## 2020-10-21 DIAGNOSIS — R278 Other lack of coordination: Secondary | ICD-10-CM

## 2020-10-21 DIAGNOSIS — R600 Localized edema: Secondary | ICD-10-CM | POA: Insufficient documentation

## 2020-10-21 DIAGNOSIS — M25641 Stiffness of right hand, not elsewhere classified: Secondary | ICD-10-CM | POA: Insufficient documentation

## 2020-10-21 DIAGNOSIS — M79641 Pain in right hand: Secondary | ICD-10-CM

## 2020-10-21 DIAGNOSIS — S62614A Displaced fracture of proximal phalanx of right ring finger, initial encounter for closed fracture: Secondary | ICD-10-CM

## 2020-10-21 NOTE — Progress Notes (Signed)
Patient is here about 2 weeks out from closed reduction percutaneous pinning of a right ring finger proximal phalanx fracture.  His x-ray was done yesterday and the alignment of the bone and location of the hardware all look good.  On examination he reports normal sensation in his finger and can flex and extend although limited.  There is no signs of any infection around the pin sites or on the palm incisions.  I plan to see him again in 2 weeks with another x-ray and likely pull the pins at that time.

## 2020-10-21 NOTE — Therapy (Signed)
[facility] [address] [place], [place], [address] Phone: [phone]   Fax:  [phone]  Occupational Therapy Evaluation  Patient Details  Name: KEMPTON MILNE MRN: [id number] Date of Birth: 05-20-1946 Referring Provider (OT): Dr. Arita Miss   Encounter Date: 10/21/2020   OT End of Session - 10/21/20 1008    Visit Number 1    Number of Visits 18    Date for OT Re-Evaluation 01/05/21    Authorization Type VA - have not yet received PW for approval    OT Start Time 0800    OT Stop Time 0910    OT Time Calculation (min) 70 min    Activity Tolerance Patient tolerated treatment well    Behavior During Therapy [facility] for tasks assessed/performed           Past Medical History:  Diagnosis Date  . GERD (gastroesophageal reflux disease)   . Hypertension     Past Surgical History:  Procedure Laterality Date  . PERCUTANEOUS PINNING Right 10/08/2020   Procedure: PERCUTANEOUS PINNING EXTREMITY;  Surgeon: Allena Napoleon, MD;  Location: [facility] OR;  Service: Plastics;  Laterality: Right;    There were no vitals filed for this visit.   Subjective Assessment - 10/21/20 0810    Subjective  I took a pill before I came for pain    Pertinent History Rt ring proximal phalanx fx s/p percutaneous pinning 10/08/20. PMH: HTN    Limitations Per protocol    Currently in Pain? No/denies             [facility] OT Assessment - 10/21/20 0001      Assessment   Medical Diagnosis Rt ring proximal phalanx fx (comminuted and displaced) s/p percutaneous pinning    Referring Provider (OT) Dr. Arita Miss    Onset Date/Surgical Date 10/08/20    Hand Dominance Right    Next MD Visit today    Prior Therapy none      Precautions   Precautions Other (comment)    Precaution Comments per proximal phalanx fx percutaneous pinning protocol    Required Braces or Orthoses Other Brace/Splint    Other Brace/Splint Hand based safe position splint (per protocol)        Home  Environment   Lives With Spouse      Prior Function   Level of Independence Independent    Vocation Retired      ADL   ADL comments Mod I BADLS using Lt non dominant hand      Written Expression   Dominant Hand Right    Handwriting --   unable at this time     Observation/Other Assessments   Observations Pt came wrapped and fully protected, 3 pins w/ caps - 1 at metacarpal, 2 at proximal phalanx      Edema   Edema moderate Rt ring finger                    OT Treatments/Exercises (OP) - 10/21/20 0001      ADLs   ADL Comments Therapist unwrapped dressing carefully and cleaned hand prior to splint fabrication. Educated pt on hand hygiene, pin sit care, and precautions      Splinting   Splinting Fabricated and fitted hand based safe position splint w/ involved digit (ring finger) and small finger included. Issued splint and reviewed wear and care                 OT Education -  10/21/20 0914    Education Details splint wear and care, hygiene care including pin site care, precautions    Person(s) Educated Patient    Methods Explanation;Handout;Demonstration    Comprehension Verbalized understanding            OT Short Term Goals - 10/21/20 1404      OT SHORT TERM GOAL #1   Title Independent with splint wear and care    Time 4    Period Weeks    Status On-going      OT SHORT TERM GOAL #2   Title Independent with initial ROM HEP (once cleared by MD)    Time 4    Period Weeks    Status New      OT SHORT TERM GOAL #3   Title Pt to verbalize understanding with edema management strategies    Time 4    Period Weeks    Status New      OT SHORT TERM GOAL #4   Title Pt to demo 90% or greater composite finger flexion and extension Rt dominant hand in prep for functional grasp/release    Time 4    Period Weeks    Status New             OT Long Term Goals - 10/21/20 1409      OT LONG TERM GOAL #1   Title Independent with updated HEP     Time 8    Period Weeks    Status New      OT LONG TERM GOAL #2   Title Pt to return to using Rt hand as dominant hand for all BADLS including wriitng    Time 8    Period Weeks    Status New      OT LONG TERM GOAL #3   Title Pt to demo 30 lbs grip strength or greater Rt hand to assist in opening jars/containers    Time 8    Period Weeks    Status New      OT LONG TERM GOAL #4   Title Pt to demo at least 95* Rt ring PIP flexion and no more than 25* extensor lag    Time 8    Period Weeks    Status New                 Plan - 10/21/20 1010    Clinical Impression Statement Pt is a 74 y.o. male who presents to OPOT s/p closed reduction and percutaneous pinning on 10/08/20 for Rt ring proximal phalanx fx (comminuted and displaced). Pt also had palmer laceration closure. Pt comes to O.T. today for evaluation and splinting purposes and will need further therapy to address stiffness, ROM, strength, edema, and coordination Rt dominant hand.    OT Occupational Profile and History Problem Focused Assessment - Including review of records relating to presenting problem    Occupational performance deficits (Please refer to evaluation for details): ADL's;IADL's;Leisure    Body Structure / Function / Physical Skills ADL;Strength;Dexterity;Pain;Edema;UE functional use;Scar mobility;IADL;ROM;Flexibility;Coordination;FMC;Skin integrity;Wound    Rehab Potential Good    Clinical Decision Making Several treatment options, min-mod task modification necessary    Comorbidities Affecting Occupational Performance: None    Modification or Assistance to Complete Evaluation  No modification of tasks or assist necessary to complete eval    OT Frequency 1x / week    OT Duration 2 weeks   then increase to 2x/wk for 8 weeks   OT Treatment/Interventions Self-care/ADL  training;Moist Heat;Fluidtherapy;DME and/or AE instruction;Splinting;Contrast Bath;Compression bandaging;Therapeutic  activities;Ultrasound;Therapeutic exercise;Scar mobilization;Cryotherapy;Passive range of motion;Electrical Stimulation;Paraffin;Manual Therapy;Patient/family education    Plan splint check and adjustments prn    Consulted and Agree with Plan of Care Patient           Patient will benefit from skilled therapeutic intervention in order to improve the following deficits and impairments:   Body Structure / Function / Physical Skills: ADL, Strength, Dexterity, Pain, Edema, UE functional use, Scar mobility, IADL, ROM, Flexibility, Coordination, FMC, Skin integrity, Wound       Visit Diagnosis: Stiffness of right hand, not elsewhere classified  Pain in right hand  Muscle weakness (generalized)  Localized edema  Other lack of coordination    Problem List There are no problems to display for this patient.   Kelli Churn, OTR/L 10/21/2020, 2:13 PM  [facility] [address] [place], [place], [address] Phone: [phone]   Fax:  [phone]  Name: NICOLAUS ANDEL MRN: [id number] Date of Birth: 1946/04/26

## 2020-10-21 NOTE — Patient Instructions (Signed)
°  WEARING SCHEDULE:  Wear splint at ALL times except for hygiene care  PURPOSE:  To prevent movement and for protection until injury can heal  CARE OF SPLINT:  Keep splint away from heat sources including: stove, radiator or furnace, or a car in sunlight. The splint can melt and will no longer fit you properly  Keep away from pets and children  Clean the splint with rubbing alcohol 1-2 times per day. Clean the base of the pins using Q-tips dipped in hydrogen peroxide 2x/day.  * During this time, make sure you also clean your hand/arm as instructed by your therapist and/or perform dressing changes as needed. Then dry hand/arm completely before replacing splint. (When cleaning hand/arm, keep it immobilized in same position until splint is replaced)  PRECAUTIONS/POTENTIAL PROBLEMS: *If you notice or experience increased pain, swelling, numbness, or a lingering reddened area from the splint: Contact your therapist immediately by calling [phone]. You must wear the splint for protection, but we will get you scheduled for adjustments as quickly as possible.  (If only straps or hooks need to be replaced and NO adjustments to the splint need to be made, just call the office ahead and let them know you are coming in)  If you have any medical concerns or signs of infection, please call your doctor immediately

## 2020-10-22 ENCOUNTER — Telehealth: Payer: Self-pay | Admitting: Plastic Surgery

## 2020-10-22 NOTE — Telephone Encounter (Signed)
Called the patient, Daniel Compton, to inquire about his [facility] Care ID #. He very firmly stated that I needed to contact [facility] because they have everything and he was not giving it to me. He said "I was just there yesterday and told you the same thing then." I explained that I was trying to assist our front desk in working through this issue so that he could continue to get care and that we were following the proper steps for the VA CC program. He told me to "call the [facility] office in [place] or [place] and figure it out." I acknowledged his request and the patient ended the call.   Called VA at 10:51 am. Was transferred to the community care liaison. I explained that our mutual patient, [name], refused to provide his [facility] Card - stating that he did not have one. I explained that he was seen in our emergency department on 10/08/2020, had surgery with one of our surgeons that same day, and was advised to follow up with our office afterward for care. Part of the protocol involves sending the patient to a hand therapist for splinting. However, we need to make sure we have an authorization on file so that we can continue to see the patient under his [facility] Community Care benefits. The liaison explained that the service member is responsible for contacting the [facility] within 72 hours of an ED visit to advise of the treatment needed and where he was seen, and an auth # would be provided for the ED visit. After the ED visit, the service member must then follow up with his PCP at the [place] and obtain an authorization for services for continuity of care. As of 10/22/2020 at 11:00 am, Mr. [name] has NOT done either of these and there is no authorization on file for him at all. At this point, the patient can follow the guidelines or be billed as a self-pay patient. I will relay this information to our office manager for further review.

## 2020-10-26 ENCOUNTER — Ambulatory Visit: Payer: No Typology Code available for payment source | Admitting: Occupational Therapy

## 2020-11-02 ENCOUNTER — Ambulatory Visit: Payer: No Typology Code available for payment source | Admitting: Occupational Therapy

## 2020-11-02 ENCOUNTER — Other Ambulatory Visit: Payer: Self-pay

## 2020-11-02 DIAGNOSIS — M25641 Stiffness of right hand, not elsewhere classified: Secondary | ICD-10-CM

## 2020-11-02 NOTE — Therapy (Signed)
[facility] [address] [place], [place], [address] Phone: [phone]   Fax:  [phone]  Occupational Therapy Treatment  Patient Details  Name: Daniel Compton MRN: [id number] Date of Birth: [birth year] Referring Provider (OT): Dr. Arita Miss   Encounter Date: 11/02/2020   OT End of Session - 11/02/20 0917    Visit Number 2    Number of Visits 18    Date for OT Re-Evaluation 01/05/21    Authorization Type VA - have not yet received PW for approval    OT Start Time 0845    OT Stop Time 0910    OT Time Calculation (min) 25 min    Activity Tolerance Patient tolerated treatment well    Behavior During Therapy [facility] for tasks assessed/performed           Past Medical History:  Diagnosis Date  . GERD (gastroesophageal reflux disease)   . Hypertension     Past Surgical History:  Procedure Laterality Date  . PERCUTANEOUS PINNING Right 10/08/2020   Procedure: PERCUTANEOUS PINNING EXTREMITY;  Surgeon: Allena Napoleon, MD;  Location: [facility] OR;  Service: Plastics;  Laterality: Right;    There were no vitals filed for this visit.   Subjective Assessment - 11/02/20 0916    Subjective  I don't know why VA hasn't approved therapy visits. One of my pins fell out and the other one is missing a cap and digging into my skin    Pertinent History Rt ring proximal phalanx fx s/p percutaneous pinning 10/08/20. PMH: HTN    Limitations Per protocol    Currently in Pain? No/denies          Therapist asked about missed appointment last week - pt reported he never got a reminder call. Therapist said it should have also been on his schedule.  Also discussed VA has not authorized therapy visits because he would not allow them to scan his VA card at the MD office (per [facility] report) - pt denied this and therapist recommended discussing further with [facility] staff.  Patient has no complaints with splint however one of the two distal pins had come  completely out and the more proximal pin has lost it's cap and was digging into skin. Therapist/pt was able to gently pull away from skin and place foam barrier b/t skin and pin, however therapist did inbasket message referring MD to report pin concerns. Pt has appointment with MD on 11/05/20 to hopefully remove remaining pins, but recommended being seen earlier if possible. Did not initiate ROM today secondary to positioning of pin.  Reviewed wear and care of splint and pin site care. Issued more stockinette and reviewed donning/doffing carefully as to not pull pins.                         OT Short Term Goals - 10/21/20 1404      OT SHORT TERM GOAL #1   Title Independent with splint wear and care    Time 4    Period Weeks    Status On-going      OT SHORT TERM GOAL #2   Title Independent with initial ROM HEP (once cleared by MD)    Time 4    Period Weeks    Status New      OT SHORT TERM GOAL #3   Title Pt to verbalize understanding with edema management strategies    Time 4    Period Weeks  Status New      OT SHORT TERM GOAL #4   Title Pt to demo 90% or greater composite finger flexion and extension Rt dominant hand in prep for functional grasp/release    Time 4    Period Weeks    Status New             OT Long Term Goals - 10/21/20 1409      OT LONG TERM GOAL #1   Title Independent with updated HEP    Time 8    Period Weeks    Status New      OT LONG TERM GOAL #2   Title Pt to return to using Rt hand as dominant hand for all BADLS including wriitng    Time 8    Period Weeks    Status New      OT LONG TERM GOAL #3   Title Pt to demo 30 lbs grip strength or greater Rt hand to assist in opening jars/containers    Time 8    Period Weeks    Status New      OT LONG TERM GOAL #4   Title Pt to demo at least 95* Rt ring PIP flexion and no more than 25* extensor lag    Time 8    Period Weeks    Status New                 Plan -  11/02/20 1740    Clinical Impression Statement Pt unable to progress today via protocol because of proximal pin site precariously placed w/ cap off. Pt has appointment with MD on Thursday but message placed to MD to recommend seeing him sooner if able. Pt to return to therapy next week hopefully with pins removed to begin A/ROM.    OT Occupational Profile and History Problem Focused Assessment - Including review of records relating to presenting problem    Occupational performance deficits (Please refer to evaluation for details): ADL's;IADL's;Leisure    Body Structure / Function / Physical Skills ADL;Strength;Dexterity;Pain;Edema;UE functional use;Scar mobility;IADL;ROM;Flexibility;Coordination;FMC;Skin integrity;Wound    Rehab Potential Good    Clinical Decision Making Several treatment options, min-mod task modification necessary    Comorbidities Affecting Occupational Performance: None    Modification or Assistance to Complete Evaluation  No modification of tasks or assist necessary to complete eval    OT Frequency 1x / week    OT Duration 2 weeks   then increase to 2x/wk for 8 weeks   OT Treatment/Interventions Self-care/ADL training;Moist Heat;Fluidtherapy;DME and/or AE instruction;Splinting;Contrast Bath;Compression bandaging;Therapeutic activities;Ultrasound;Therapeutic exercise;Scar mobilization;Cryotherapy;Passive range of motion;Electrical Stimulation;Paraffin;Manual Therapy;Patient/family education    Plan begin A/ROM per protocol    Consulted and Agree with Plan of Care Patient           Patient will benefit from skilled therapeutic intervention in order to improve the following deficits and impairments:   Body Structure / Function / Physical Skills: ADL,Strength,Dexterity,Pain,Edema,UE functional use,Scar mobility,IADL,ROM,Flexibility,Coordination,FMC,Skin integrity,Wound       Visit Diagnosis: Stiffness of right hand, not elsewhere classified    Problem List There are no  problems to display for this patient.   Kelli Churn, OTR/L 11/02/2020, 9:20 AM  [facility] [address] [place], [place], [address] Phone: [phone]   Fax:  [phone]  Name: Daniel Compton MRN: [id number] Date of Birth: [birth year]

## 2020-11-05 ENCOUNTER — Ambulatory Visit (HOSPITAL_COMMUNITY)
Admission: RE | Admit: 2020-11-05 | Discharge: 2020-11-05 | Disposition: A | Discharge status: Home / Self Care | Payer: No Typology Code available for payment source | Source: Ambulatory Visit | Attending: Plastic Surgery | Admitting: Plastic Surgery

## 2020-11-05 ENCOUNTER — Other Ambulatory Visit: Payer: Self-pay

## 2020-11-05 ENCOUNTER — Encounter: Payer: Self-pay | Admitting: Plastic Surgery

## 2020-11-05 ENCOUNTER — Ambulatory Visit (INDEPENDENT_AMBULATORY_CARE_PROVIDER_SITE_OTHER): Payer: No Typology Code available for payment source | Admitting: Plastic Surgery

## 2020-11-05 VITALS — BP 139/87 | HR 92 | Temp 97.7°F

## 2020-11-05 DIAGNOSIS — S62614A Displaced fracture of proximal phalanx of right ring finger, initial encounter for closed fracture: Secondary | ICD-10-CM

## 2020-11-05 NOTE — Progress Notes (Signed)
Patient presents 1 month out from right ring finger closed reduction percutaneous pinning.  He feels like things are going well but has some swelling in the hand.  On exam one of the pins has fallen out and 2 pins remain.  There is good sensation of the finger.  I do not see any obvious erythema or drainage around the pin sites but there is some diffuse swelling on the dorsum of the hand.  He did not complain of much pain to palpation over the previous fracture sites so both pins were removed.  I placed him back in his splint and have encouraged him to go to therapy to start working on range of motion.  After another few weeks they can probably progress to strengthening.  I did ask him to get an x-ray today to confirm appropriate alignment of the fracture segments.  I will plan to see him again in another 3 to 4 weeks to check his progress.

## 2020-11-09 ENCOUNTER — Ambulatory Visit: Payer: No Typology Code available for payment source | Admitting: Occupational Therapy

## 2020-11-09 ENCOUNTER — Other Ambulatory Visit: Payer: Self-pay

## 2020-11-09 DIAGNOSIS — M6281 Muscle weakness (generalized): Secondary | ICD-10-CM

## 2020-11-09 DIAGNOSIS — M25641 Stiffness of right hand, not elsewhere classified: Secondary | ICD-10-CM

## 2020-11-09 NOTE — Patient Instructions (Signed)
  1. Flexor Tendon Gliding (Active Hook Fist)   With fingers and knuckles straight, bend middle and tip joints ONLY. Do not bend large knuckles. Repeat _10-15___ times. Do _4-6___ sessions per day.  2. MP Flexion (Active)   bend large knuckles ONLY as far as they will go, keeping fingers straight straight. Repeat _10-15___ times. Do __4-6__ sessions per day.      3. Finger Flexion / Extension   With palm up, bend fingers of left hand toward palm, making a  fist. Straighten fingers, opening fist. Repeat sequence _10-15___ times per session. Do _4-6__ sessions per day.  4. Hold big knuckles in bent position like exercise #2, bend and STRAIGHTEN middle joints of fingers (especially ring finger middle joint) while keeping big knuckles bent. Do 10-15 reps, 6 times per day

## 2020-11-09 NOTE — Therapy (Signed)
[facility] [address] [place], [place], [address] Phone: [phone]   Fax:  [phone]  Occupational Therapy Treatment  Patient Details  Name: Daniel Compton MRN: [id number] Date of Birth: 09-16-1946 Referring Provider (OT): Dr. Arita Miss   Encounter Date: 11/09/2020   OT End of Session - 11/09/20 1424    Visit Number 3    Number of Visits 18    Date for OT Re-Evaluation 01/05/21    Authorization Type VA - have not yet received PW for approval    OT Start Time 1230    OT Stop Time 1315    OT Time Calculation (min) 45 min    Activity Tolerance Patient tolerated treatment well    Behavior During Therapy [facility] for tasks assessed/performed           Past Medical History:  Diagnosis Date  . GERD (gastroesophageal reflux disease)   . Hypertension     Past Surgical History:  Procedure Laterality Date  . PERCUTANEOUS PINNING Right 10/08/2020   Procedure: PERCUTANEOUS PINNING EXTREMITY;  Surgeon: Allena Napoleon, MD;  Location: [facility] OR;  Service: Plastics;  Laterality: Right;    There were no vitals filed for this visit.   Subjective Assessment - 11/09/20 1236    Subjective  I took a pain pill today    Pertinent History Rt ring proximal phalanx fx s/p percutaneous pinning 10/08/20. PMH: HTN    Limitations Per protocol    Currently in Pain? No/denies           Pt arrived w/ pins removed and cleared to begin A/ROM per latest MD note. Unable to do fluidotherapy today due to remaining scab at incision area, therefore applied hot pack x 8 min to decrease stiffness prior to A/ROM. Pt then performed A/ROM in full composite flexion, blocking, reverse blocking, and hook positions w/ mod cueing to perform correctly. Emphasized reverse blocking ex to work towards PIP extension w/ MP blocked in flexion due to extensor lag.  Ultrasound x 8 min, to Rt palm and base of ring finger (volarly) to decrease edema and manage scar tissue -  pt w/ noticeably decreased edema and increased ROM following ultrasound.  Pt issued tensogrip and finger compression sleeve to help reduce swelling and instructed to wear prn during the day and to wear at night with splint. Pt reports one of the straps/hooks has fallen off splint but he did not bring it to adjust, therefore instructed to bring in tomorrow for quick adjustment (no appt needed). Emphasized that patient should still be wearing splint at night and in between exercises especially when out.                      OT Education - 11/09/20 1304    Education Details A/ROM HEP    Person(s) Educated Patient    Methods Explanation;Handout;Demonstration;Verbal cues    Comprehension Verbalized understanding;Returned demonstration;Verbal cues required            OT Short Term Goals - 11/09/20 1424      OT SHORT TERM GOAL #1   Title Independent with splint wear and care    Time 4    Period Weeks    Status Achieved      OT SHORT TERM GOAL #2   Title Independent with initial ROM HEP (once cleared by MD)    Time 4    Period Weeks    Status On-going  OT SHORT TERM GOAL #3   Title Pt to verbalize understanding with edema management strategies    Time 4    Period Weeks    Status On-going      OT SHORT TERM GOAL #4   Title Pt to demo 90% or greater composite finger flexion and extension Rt dominant hand in prep for functional grasp/release    Time 4    Period Weeks    Status New             OT Long Term Goals - 10/21/20 1409      OT LONG TERM GOAL #1   Title Independent with updated HEP    Time 8    Period Weeks    Status New      OT LONG TERM GOAL #2   Title Pt to return to using Rt hand as dominant hand for all BADLS including wriitng    Time 8    Period Weeks    Status New      OT LONG TERM GOAL #3   Title Pt to demo 30 lbs grip strength or greater Rt hand to assist in opening jars/containers    Time 8    Period Weeks    Status New       OT LONG TERM GOAL #4   Title Pt to demo at least 95* Rt ring PIP flexion and no more than 25* extensor lag    Time 8    Period Weeks    Status New                 Plan - 11/09/20 1425    Clinical Impression Statement Pt arrived w/ pins removed and ok to begin A/ROM per MD note (refer to in EPIC). Pt tolerating A/ROM well however has significant extensor lag at PIP joint of ring finger.    OT Occupational Profile and History Problem Focused Assessment - Including review of records relating to presenting problem    Occupational performance deficits (Please refer to evaluation for details): ADL's;IADL's;Leisure    Body Structure / Function / Physical Skills ADL;Strength;Dexterity;Pain;Edema;UE functional use;Scar mobility;IADL;ROM;Flexibility;Coordination;FMC;Skin integrity;Wound    Rehab Potential Good    Clinical Decision Making Several treatment options, min-mod task modification necessary    Comorbidities Affecting Occupational Performance: None    Modification or Assistance to Complete Evaluation  No modification of tasks or assist necessary to complete eval    OT Frequency 1x / week    OT Duration 2 weeks   then increase to 2x/wk for 8 weeks   OT Treatment/Interventions Self-care/ADL training;Moist Heat;Fluidtherapy;DME and/or AE instruction;Splinting;Contrast Bath;Compression bandaging;Therapeutic activities;Ultrasound;Therapeutic exercise;Scar mobilization;Cryotherapy;Passive range of motion;Electrical Stimulation;Paraffin;Manual Therapy;Patient/family education    Plan Continue pulsed [place] for scar tissue and edema management, continue A/ROM and begin gentle P/ROM    Consulted and Agree with Plan of Care Patient           Patient will benefit from skilled therapeutic intervention in order to improve the following deficits and impairments:   Body Structure / Function / Physical Skills: ADL,Strength,Dexterity,Pain,Edema,UE functional use,Scar  mobility,IADL,ROM,Flexibility,Coordination,FMC,Skin integrity,Wound       Visit Diagnosis: Stiffness of right hand, not elsewhere classified  Muscle weakness (generalized)    Problem List There are no problems to display for this patient.   Kelli Churn, OTR/L 11/09/2020, 2:28 PM  [facility] [address] [place], [place], [address] Phone: [phone]   Fax:  [phone]  Name: BYARD CARRANZA  MRN: [id number] Date of Birth: 1946-01-09

## 2020-11-18 ENCOUNTER — Ambulatory Visit: Payer: No Typology Code available for payment source | Admitting: Occupational Therapy

## 2020-11-23 ENCOUNTER — Ambulatory Visit: Payer: No Typology Code available for payment source | Attending: Plastic Surgery | Admitting: Occupational Therapy

## 2020-11-23 DIAGNOSIS — M6281 Muscle weakness (generalized): Secondary | ICD-10-CM | POA: Insufficient documentation

## 2020-11-23 DIAGNOSIS — R600 Localized edema: Secondary | ICD-10-CM | POA: Insufficient documentation

## 2020-11-23 DIAGNOSIS — M25641 Stiffness of right hand, not elsewhere classified: Secondary | ICD-10-CM | POA: Insufficient documentation

## 2020-11-23 DIAGNOSIS — M79641 Pain in right hand: Secondary | ICD-10-CM | POA: Insufficient documentation

## 2020-11-24 ENCOUNTER — Ambulatory Visit: Payer: No Typology Code available for payment source | Admitting: Occupational Therapy

## 2020-11-24 ENCOUNTER — Other Ambulatory Visit: Payer: Self-pay

## 2020-11-24 DIAGNOSIS — M79641 Pain in right hand: Secondary | ICD-10-CM | POA: Diagnosis present

## 2020-11-24 DIAGNOSIS — M25641 Stiffness of right hand, not elsewhere classified: Secondary | ICD-10-CM | POA: Diagnosis present

## 2020-11-24 DIAGNOSIS — R600 Localized edema: Secondary | ICD-10-CM

## 2020-11-24 DIAGNOSIS — M6281 Muscle weakness (generalized): Secondary | ICD-10-CM | POA: Diagnosis present

## 2020-11-24 NOTE — Therapy (Signed)
[facility] [address] [place], [place], [address] Phone: [phone]   Fax:  [phone]  Occupational Therapy Treatment  Patient Details  Name: Daniel Compton MRN: [id number] Date of Birth: 1946/10/02 Referring Provider (OT): Dr. Claudia Desanctis   Encounter Date: 11/24/2020   OT End of Session - 11/24/20 1459    Visit Number 4    Number of Visits 18    Date for OT Re-Evaluation 01/05/21    Authorization Type VA - have not yet received PW for approval    OT Start Time 1400    OT Stop Time 1445    OT Time Calculation (min) 45 min    Activity Tolerance Patient tolerated treatment well    Behavior During Therapy [facility] for tasks assessed/performed           Past Medical History:  Diagnosis Date  . GERD (gastroesophageal reflux disease)   . Hypertension     Past Surgical History:  Procedure Laterality Date  . PERCUTANEOUS PINNING Right 10/08/2020   Procedure: PERCUTANEOUS PINNING EXTREMITY;  Surgeon: Cindra Presume, MD;  Location: [facility];  Service: Plastics;  Laterality: Right;    There were no vitals filed for this visit.   Subjective Assessment - 11/24/20 1409    Subjective  I didn't take pain medicine today because it ran out. I haven't been wearing my splint because the strap won't stay on    Pertinent History Rt ring proximal phalanx fx s/p percutaneous pinning 10/08/20. PMH: HTN    Limitations Per protocol    Currently in Pain? Yes    Pain Score 9     Pain Location --   ring finger   Pain Orientation Right    Pain Descriptors / Indicators Aching;Shooting    Pain Type Chronic pain    Pain Onset More than a month ago    Pain Frequency Constant    Aggravating Factors  cold weather    Pain Relieving Factors Pain meds (ran out), soaking it           Pt arrived today without splint and never brought in for adjustments after 11/09/20 visit, therefore has been going without splint. Pt now almost 7 weeks post op.   Ultrasound x 8 min, 20% pulsed, 3 Mhz, 0.8 wts/cm2 over volar hand at base of ring finger and at proximal phalanx for edema management and scar tissue management.  Reviewed A/ROM HEP - pt required mod cueing to differentiate b/t intrinsic + and intrinsic - position. Began P/ROM HEP today (per protocol) and pt required extensive review to perform correctly and for correct hand placement. Also discussed edema management strategies including contrast bath and elevation                     OT Education - 11/24/20 1457    Education Details contrast bath, P/ROM HEP    Person(s) Educated Patient    Methods Explanation;Handout;Demonstration;Verbal cues    Comprehension Verbalized understanding;Returned demonstration;Verbal cues required            OT Short Term Goals - 11/09/20 1424      OT SHORT TERM GOAL #1   Title Independent with splint wear and care    Time 4    Period Weeks    Status Achieved      OT SHORT TERM GOAL #2   Title Independent with initial ROM HEP (once cleared by MD)    Time 4    Period  Weeks    Status On-going      OT SHORT TERM GOAL #3   Title Pt to verbalize understanding with edema management strategies    Time 4    Period Weeks    Status On-going      OT SHORT TERM GOAL #4   Title Pt to demo 90% or greater composite finger flexion and extension Rt dominant hand in prep for functional grasp/release    Time 4    Period Weeks    Status New             OT Long Term Goals - 10/21/20 1409      OT LONG TERM GOAL #1   Title Independent with updated HEP    Time 8    Period Weeks    Status New      OT LONG TERM GOAL #2   Title Pt to return to using Rt hand as dominant hand for all BADLS including wriitng    Time 8    Period Weeks    Status New      OT LONG TERM GOAL #3   Title Pt to demo 30 lbs grip strength or greater Rt hand to assist in opening jars/containers    Time 8    Period Weeks    Status New      OT LONG TERM GOAL #4    Title Pt to demo at least 95* Rt ring PIP flexion and no more than 25* extensor lag    Time 8    Period Weeks    Status New                 Plan - 11/24/20 1459    Clinical Impression Statement Pt arrived with no splint and forgot to bring in for adjustments after last visit on 11/09/20. Pt has been without splint protection since before 11/09/20. Pt now almost 7 weeks post-op.    OT Occupational Profile and History Problem Focused Assessment - Including review of records relating to presenting problem    Occupational performance deficits (Please refer to evaluation for details): ADL's;IADL's;Leisure    Body Structure / Function / Physical Skills ADL;Strength;Dexterity;Pain;Edema;UE functional use;Scar mobility;IADL;ROM;Flexibility;Coordination;FMC;Skin integrity;Wound    Rehab Potential Good    Clinical Decision Making Several treatment options, min-mod task modification necessary    Comorbidities Affecting Occupational Performance: None    Modification or Assistance to Complete Evaluation  No modification of tasks or assist necessary to complete eval    OT Frequency 1x / week    OT Duration 2 weeks   then increase to 2x/wk for 8 weeks   OT Treatment/Interventions Self-care/ADL training;Moist Heat;Fluidtherapy;DME and/or AE instruction;Splinting;Contrast Bath;Compression bandaging;Therapeutic activities;Ultrasound;Therapeutic exercise;Scar mobilization;Cryotherapy;Passive range of motion;Electrical Stimulation;Paraffin;Manual Therapy;Patient/family education    Plan adjust pm splint - pt to bring in for tomorrow's appointment, consider LMB splint for dynamic extension, send note via EPIC to MD re: progression to strengthening next week as pt sees MD on 11/28/19.    Consulted and Agree with Plan of Care Patient           Patient will benefit from skilled therapeutic intervention in order to improve the following deficits and impairments:   Body Structure / Function / Physical  Skills: ADL,Strength,Dexterity,Pain,Edema,UE functional use,Scar mobility,IADL,ROM,Flexibility,Coordination,FMC,Skin integrity,Wound       Visit Diagnosis: Stiffness of right hand, not elsewhere classified  Pain in right hand  Localized edema    Problem List There are no problems to display for this patient.  Kelli Churn, OTR/L 11/24/2020, 3:05 PM  [facility] [address] [place], [place], [address] Phone: [phone]   Fax:  [phone]  Name: Daniel Compton MRN: [id number] Date of Birth: 15-Sep-1946

## 2020-11-24 NOTE — Patient Instructions (Signed)
  Contrast Bath  Prepare the baths.  Cold = 55-65 degrees     Hot = 105-110 degrees  Starting with the hot, dip hand or foot all the way into the water and hold there for selected duration.  Preferably 3 minutes.  After selected duration is up, dip hand or foot into the cold for 1/3 duration of the hot. (3 minutes hot, 1 minute cold)  Alternate back and forth for the times indicated for no more than a total of 20 minutes ending with hot.    PIP / DIP Composite Flexion (Passive Stretch)    Use other hand to bend middle and tip joints of ___ring___ finger. Hold _10___ seconds. Repeat ___5_ times. Do __4-6__ sessions per day.  PIP Extension (Passive    Use thumb of other hand on top of joint and two fingers under- neath on either side to straighten middle joint of __ring____ finger. Hold __10__ seconds. Repeat __5__ times. Do __4-6__ sessions per day.

## 2020-11-25 ENCOUNTER — Ambulatory Visit: Payer: No Typology Code available for payment source | Admitting: Occupational Therapy

## 2020-11-25 ENCOUNTER — Telehealth: Payer: Self-pay

## 2020-11-25 DIAGNOSIS — R600 Localized edema: Secondary | ICD-10-CM

## 2020-11-25 DIAGNOSIS — M25641 Stiffness of right hand, not elsewhere classified: Secondary | ICD-10-CM

## 2020-11-25 DIAGNOSIS — M79641 Pain in right hand: Secondary | ICD-10-CM

## 2020-11-25 NOTE — Therapy (Signed)
[facility] [address] [place], [place], [address] Phone: [phone]   Fax:  [phone]  Occupational Therapy Treatment  Patient Details  Name: Daniel Compton MRN: [id number] Date of Birth: September 17, 1946 Referring Provider (OT): Dr. Arita Miss   Encounter Date: 11/25/2020   OT End of Session - 11/25/20 1422    Visit Number 5    Number of Visits 18    Date for OT Re-Evaluation 01/05/21    Authorization Type VA - have not yet received PW for approval    OT Start Time 1230    OT Stop Time 1315    OT Time Calculation (min) 45 min    Activity Tolerance Patient tolerated treatment well    Behavior During Therapy [facility] for tasks assessed/performed           Past Medical History:  Diagnosis Date  . GERD (gastroesophageal reflux disease)   . Hypertension     Past Surgical History:  Procedure Laterality Date  . PERCUTANEOUS PINNING Right 10/08/2020   Procedure: PERCUTANEOUS PINNING EXTREMITY;  Surgeon: Allena Napoleon, MD;  Location: [facility] OR;  Service: Plastics;  Laterality: Right;    There were no vitals filed for this visit.   Subjective Assessment - 11/25/20 1420    Subjective  I did the contrast bath and the exercises - they both seem to be helping    Pertinent History Rt ring proximal phalanx fx s/p percutaneous pinning 10/08/20. PMH: HTN    Limitations Per protocol    Currently in Pain? Yes    Pain Score 3     Pain Location --   ring finger   Pain Orientation Right    Pain Descriptors / Indicators Aching    Pain Type Chronic pain    Pain Onset More than a month ago    Pain Frequency Constant    Aggravating Factors  cold weather    Pain Relieving Factors soaking it, ROM (IF not overdone)           Ultrasound x 8 min. (4 min volarly at base of 4th digit and 4 min dorsally over proximal phalanx and base) at previous parameters 20% pulsed.  Rt ring PIP extensor lag = -20* Adjusted splint with new straps and  encouraged to continue wearing at night and when up and about at least until seen by MD on 11/27/20 and they can further determine need for splint. Assessed pt for finger spring PIP extension assist finger splint but did not fit well due to swelling (even at largest size) - pt may do well with reverse knuckle bender and will order today.  Reviewed HEP and positioning with these - tried buddy straps with A/ROM HEP but instructed to take off with P/ROM HEP P/ROM in PIP flexion and extension and composite flexion and extension.                        OT Short Term Goals - 11/25/20 1422      OT SHORT TERM GOAL #1   Title Independent with splint wear and care    Time 4    Period Weeks    Status Achieved      OT SHORT TERM GOAL #2   Title Independent with initial ROM HEP (once cleared by MD)    Time 4    Period Weeks    Status Achieved      OT SHORT TERM GOAL #3   Title  Pt to verbalize understanding with edema management strategies    Time 4    Period Weeks    Status Achieved      OT SHORT TERM GOAL #4   Title Pt to demo 90% or greater composite finger flexion and extension Rt dominant hand in prep for functional grasp/release    Time 4    Period Weeks    Status On-going             OT Long Term Goals - 10/21/20 1409      OT LONG TERM GOAL #1   Title Independent with updated HEP    Time 8    Period Weeks    Status New      OT LONG TERM GOAL #2   Title Pt to return to using Rt hand as dominant hand for all BADLS including wriitng    Time 8    Period Weeks    Status New      OT LONG TERM GOAL #3   Title Pt to demo 30 lbs grip strength or greater Rt hand to assist in opening jars/containers    Time 8    Period Weeks    Status New      OT LONG TERM GOAL #4   Title Pt to demo at least 95* Rt ring PIP flexion and no more than 25* extensor lag    Time 8    Period Weeks    Status New                 Plan - 11/25/20 1423    Clinical Impression  Statement Pt now 7 weeks post-op. Pt with decreased swelling today and better ROM after last session. Pt sees MD/PA-C on 11/27/20 - will send note via EPIC    OT Occupational Profile and History Problem Focused Assessment - Including review of records relating to presenting problem    Occupational performance deficits (Please refer to evaluation for details): ADL's;IADL's;Leisure    Body Structure / Function / Physical Skills ADL;Strength;Dexterity;Pain;Edema;UE functional use;Scar mobility;IADL;ROM;Flexibility;Coordination;FMC;Skin integrity;Wound    Rehab Potential Good    Clinical Decision Making Several treatment options, min-mod task modification necessary    Comorbidities Affecting Occupational Performance: None    Modification or Assistance to Complete Evaluation  No modification of tasks or assist necessary to complete eval    OT Frequency 1x / week    OT Duration 2 weeks   then increase to 2x/wk for 8 weeks   OT Treatment/Interventions Self-care/ADL training;Moist Heat;Fluidtherapy;DME and/or AE instruction;Splinting;Contrast Bath;Compression bandaging;Therapeutic activities;Ultrasound;Therapeutic exercise;Scar mobilization;Cryotherapy;Passive range of motion;Electrical Stimulation;Paraffin;Manual Therapy;Patient/family education    Plan progress to strengthening if cleared by MD, wean from splint as able, order put in for reverse knuckle bender to assist in extensor lag    Consulted and Agree with Plan of Care Patient           Patient will benefit from skilled therapeutic intervention in order to improve the following deficits and impairments:   Body Structure / Function / Physical Skills: ADL,Strength,Dexterity,Pain,Edema,UE functional use,Scar mobility,IADL,ROM,Flexibility,Coordination,FMC,Skin integrity,Wound       Visit Diagnosis: Stiffness of right hand, not elsewhere classified  Pain in right hand  Localized edema    Problem List There are no problems to display for  this patient.   Carey Bullocks, OTR/L 11/25/2020, 2:35 PM  [facility] [address] [place], [place], [address] Phone: [phone]   Fax:  [phone]  Name: JIBRAN CROOKSHANKS  MRN: [id number] Date of Birth: 1946-01-09

## 2020-11-25 NOTE — Progress Notes (Deleted)
Patient is a 75 yr-old male here for follow-up after undergoing closed reduction percutaneous pinning of right ring finger proximal phalanx fracture

## 2020-11-25 NOTE — Telephone Encounter (Signed)
Thanks for the message.  I am fine with him discontinuing the splint at this point.  He is cleared for strengthening as long as it is not overly painful for him.  We will let you know if anything changes tomorrow.  Thanks again.

## 2020-11-25 NOTE — Telephone Encounter (Signed)
Dr. Arita Miss Joni Fears,   You see Mr. Ruelas this Friday, 11/27/20 for follow up. He is now 7 weeks post-op. After you evaluate him tomorrow, please let me know if we may proceed with strengthening for Rt hand. We have been doing A/ROM and P/ROM started this week (since he was unable to come last week).  Also let me know if he still needs to wear his hand based splint b/t exercises vs. wearing only at night OR completely d/c splint. I am ordering a finger splint to assist with -20* PIP extensor lag to wear some during the day. He still has quite a bit of swelling at proximal phalanx ring finger both volar and dorsally. He also reported he had not been wearing his splint - he had come to an appointment for O.T. on 11/09/20 reporting his strap would not stay on and he did not bring it in to me to make adjustments. I told him to come back the next day and I would fix it but he never did. I didn't see him again until 11/24/20 in which he still didn't bring the splint. I finally made adjustments today but he may not even need the splint at this point?  Thanks,  Jene Every, OTR/L

## 2020-11-26 ENCOUNTER — Telehealth: Payer: Self-pay

## 2020-11-26 NOTE — Telephone Encounter (Signed)
Patient called to say that he has an appointment with [place] tomorrow and he would like to know if he needs to go to the hospital to get an x-ray of his hand.  Please call.

## 2020-11-27 ENCOUNTER — Ambulatory Visit: Payer: No Typology Code available for payment source | Admitting: Plastic Surgery

## 2020-11-27 NOTE — Telephone Encounter (Signed)
Called on (11/26/20 @ 5:02pm) and LMOM for the patient regarding the message below.  Informing the patient that he will not need to get a x-ray before coming to his appointment today.//AB/CMA

## 2020-11-30 ENCOUNTER — Ambulatory Visit: Payer: No Typology Code available for payment source | Admitting: Occupational Therapy

## 2020-12-02 ENCOUNTER — Ambulatory Visit: Payer: No Typology Code available for payment source | Admitting: Occupational Therapy

## 2020-12-07 ENCOUNTER — Ambulatory Visit: Payer: No Typology Code available for payment source | Admitting: Occupational Therapy

## 2020-12-09 ENCOUNTER — Encounter: Payer: No Typology Code available for payment source | Admitting: Occupational Therapy

## 2020-12-10 ENCOUNTER — Ambulatory Visit: Payer: No Typology Code available for payment source | Admitting: Occupational Therapy

## 2020-12-15 ENCOUNTER — Ambulatory Visit: Payer: No Typology Code available for payment source | Admitting: Occupational Therapy

## 2020-12-15 ENCOUNTER — Other Ambulatory Visit: Payer: Self-pay

## 2020-12-15 DIAGNOSIS — M6281 Muscle weakness (generalized): Secondary | ICD-10-CM

## 2020-12-15 DIAGNOSIS — M25641 Stiffness of right hand, not elsewhere classified: Secondary | ICD-10-CM

## 2020-12-15 DIAGNOSIS — M79641 Pain in right hand: Secondary | ICD-10-CM

## 2020-12-15 DIAGNOSIS — R600 Localized edema: Secondary | ICD-10-CM

## 2020-12-15 NOTE — Therapy (Signed)
[facility] [address] [place], [place], [address] Phone: [phone]   Fax:  [phone]  Occupational Therapy Treatment  Patient Details  Name: Daniel Compton MRN: [id number] Date of Birth: Dec 24, 1945 Referring Provider (OT): Dr. Claudia Desanctis   Encounter Date: 12/15/2020   OT End of Session - 12/15/20 1305    Visit Number 6    Number of Visits 18    Date for OT Re-Evaluation 01/05/21    Authorization Type [facility] - have not yet received PW for approval    OT Start Time 1225    OT Stop Time 1300    OT Time Calculation (min) 35 min    Activity Tolerance Patient tolerated treatment well    Behavior During Therapy [facility] for tasks assessed/performed           Past Medical History:  Diagnosis Date  . GERD (gastroesophageal reflux disease)   . Hypertension     Past Surgical History:  Procedure Laterality Date  . PERCUTANEOUS PINNING Right 10/08/2020   Procedure: PERCUTANEOUS PINNING EXTREMITY;  Surgeon: Cindra Presume, MD;  Location: [facility];  Service: Plastics;  Laterality: Right;    There were no vitals filed for this visit.   Subjective Assessment - 12/15/20 1230    Subjective  I never saw the doctor. I rescheduled it. Then pain has been higher for the last 2 days since I shoveled the snow    Pertinent History Rt ring proximal phalanx fx s/p percutaneous pinning 10/08/20. PMH: HTN    Limitations Per protocol    Currently in Pain? Yes    Pain Score 9     Pain Location --   ring finger   Pain Orientation Right    Pain Descriptors / Indicators Aching;Sore    Pain Type Chronic pain    Pain Onset More than a month ago    Pain Frequency Constant    Aggravating Factors  shoveling snow    Pain Relieving Factors soaking it              [facility] OT Assessment - 12/15/20 0001      Hand Function   Right Hand Grip (lbs) 54.6 lbs    Left Hand Grip (lbs) 102.9 lbs                    OT Treatments/Exercises (OP) -  12/15/20 0001      ADLs   ADL Comments Assessed STG's and LTG's - see goal section. Discussed plan going forward - pt only needs one more session to upgrade putty and issue reverse knuckle bender (if it comes in)      Exercises   Exercises Hand      Hand Exercises   Other Hand Exercises Pt issued putty HEP w/ red resistance putty - will upgrade to green putty next week.      Modalities   Modalities Fluidotherapy      RUE Fluidotherapy   Number Minutes Fluidotherapy 10 Minutes    RUE Fluidotherapy Location Hand    Comments at beginning of session to decrease pain                  OT Education - 12/15/20 1251    Education Details Putty HEP    Person(s) Educated Patient    Methods Explanation;Handout;Demonstration;Verbal cues    Comprehension Verbalized understanding;Returned demonstration;Verbal cues required            OT Short Term Goals -  12/15/20 1305      OT SHORT TERM GOAL #1   Title Independent with splint wear and care    Time 4    Period Weeks    Status Achieved      OT SHORT TERM GOAL #2   Title Independent with initial ROM HEP (once cleared by MD)    Time 4    Period Weeks    Status Achieved      OT SHORT TERM GOAL #3   Title Pt to verbalize understanding with edema management strategies    Time 4    Period Weeks    Status Achieved      OT SHORT TERM GOAL #4   Title Pt to demo 90% or greater composite finger flexion and extension Rt dominant hand in prep for functional grasp/release    Time 4    Period Weeks    Status Achieved             OT Long Term Goals - 12/15/20 1306      OT LONG TERM GOAL #1   Title Independent with updated HEP    Time 8    Period Weeks    Status Achieved      OT LONG TERM GOAL #2   Title Pt to return to using Rt hand as dominant hand for all BADLS including wriitng    Time 8    Period Weeks    Status Achieved      OT LONG TERM GOAL #3   Title Pt to demo 30 lbs grip strength or greater Rt hand to  assist in opening jars/containers    Time 8    Period Weeks    Status Achieved   54.6 lbs     OT LONG TERM GOAL #4   Title Pt to demo at least 95* Rt ring PIP flexion and no more than 25* extensor lag    Time 8    Period Weeks    Status Achieved   flex = 95*, ext = -20*     OT LONG TERM GOAL #5   Title Pt will increase grip strength to 60 lbs Rt hand    Time 2    Period Weeks    Status New                 Plan - 12/15/20 1308    Clinical Impression Statement Pt has met all STG's and LTG's at this time. Pt reports returning to using Rt hand as dominant hand. Added 1 new LTG today    OT Occupational Profile and History Problem Focused Assessment - Including review of records relating to presenting problem    Occupational performance deficits (Please refer to evaluation for details): ADL's;IADL's;Leisure    Body Structure / Function / Physical Skills ADL;Strength;Dexterity;Pain;Edema;UE functional use;Scar mobility;IADL;ROM;Flexibility;Coordination;FMC;Skin integrity;Wound    Rehab Potential Good    Clinical Decision Making Several treatment options, min-mod task modification necessary    Comorbidities Affecting Occupational Performance: None    Modification or Assistance to Complete Evaluation  No modification of tasks or assist necessary to complete eval    OT Frequency 1x / week    OT Duration 2 weeks   then increase to 2x/wk for 8 weeks   OT Treatment/Interventions Self-care/ADL training;Moist Heat;Fluidtherapy;DME and/or AE instruction;Splinting;Contrast Bath;Compression bandaging;Therapeutic activities;Ultrasound;Therapeutic exercise;Scar mobilization;Cryotherapy;Passive range of motion;Electrical Stimulation;Paraffin;Manual Therapy;Patient/family education    Plan Pt to return in 2 weeks to upgrade to green resistance putty, reassess grip strength, and potentially  issue reverse knuckle bender if still needed and if shipment comes in, and anticipate d/c next session.     Consulted and Agree with Plan of Care Patient           Patient will benefit from skilled therapeutic intervention in order to improve the following deficits and impairments:   Body Structure / Function / Physical Skills: ADL,Strength,Dexterity,Pain,Edema,UE functional use,Scar mobility,IADL,ROM,Flexibility,Coordination,FMC,Skin integrity,Wound       Visit Diagnosis: Stiffness of right hand, not elsewhere classified  Pain in right hand  Localized edema  Muscle weakness (generalized)    Problem List There are no problems to display for this patient.   Carey Bullocks, OTR/L 12/15/2020, 1:11 PM  [facility] [address] [place], [place], [address] Phone: [phone]   Fax:  [phone]  Name: Daniel Compton MRN: [id number] Date of Birth: 10-27-1946

## 2020-12-15 NOTE — Patient Instructions (Signed)
  1. Grip Strengthening (Resistive Putty)   Squeeze putty using thumb and all fingers. Repeat _20___ times. Do __2__ sessions per day.   2. Roll putty into tube on table and pinch between each finger and thumb x 10 reps each. (Do ring and small finger together). Do 2 sessions per day

## 2020-12-17 NOTE — Progress Notes (Signed)
Patient is a 75 year old male here for follow-up after his right ring finger closed reduction percutaneous pinning with Dr. Arita Miss.  He is 10 weeks postop.  He is frustrated today that he does not have normal range of motion of his finger.  He reports that he has been working with occupational therapy, however doing so causes him pain and increased swelling which is bothersome to him.  He reports he has another OT visit in approximately 2 weeks.  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   On exam he has 2+ radial pulse, good capillary refill and normal temperature and color of right phalanges.  Incisions are intact.  Pinholes have healed nicely.  He has fairly good flexion of his right ring finger, but has some decreased extension.  He has decreased strength.  He reports decreased sensation/abnormal sensation of the right ring finger starting at the base of the finger extending distally.  There is no erythema.  He does have some swelling, it is moderate.  I discussed with the patient the x-ray results, I also discussed the patient that I would discuss his case further with Dr. Arita Miss and call the patient next week to further discuss the plan.  All of his questions were answered to his content.  There is no sign of infection.

## 2020-12-18 ENCOUNTER — Other Ambulatory Visit: Payer: Self-pay

## 2020-12-18 ENCOUNTER — Ambulatory Visit (INDEPENDENT_AMBULATORY_CARE_PROVIDER_SITE_OTHER): Payer: No Typology Code available for payment source | Admitting: Surgical

## 2020-12-18 ENCOUNTER — Encounter: Payer: Self-pay | Admitting: Plastic Surgery

## 2020-12-18 VITALS — BP 136/88 | HR 100 | Temp 98.4°F

## 2020-12-18 DIAGNOSIS — S62614D Displaced fracture of proximal phalanx of right ring finger, subsequent encounter for fracture with routine healing: Secondary | ICD-10-CM

## 2020-12-18 DIAGNOSIS — S62614K Displaced fracture of proximal phalanx of right ring finger, subsequent encounter for fracture with nonunion: Secondary | ICD-10-CM

## 2020-12-21 ENCOUNTER — Other Ambulatory Visit: Payer: Self-pay | Admitting: Surgical

## 2020-12-21 DIAGNOSIS — S62614K Displaced fracture of proximal phalanx of right ring finger, subsequent encounter for fracture with nonunion: Secondary | ICD-10-CM

## 2020-12-23 ENCOUNTER — Other Ambulatory Visit: Payer: Self-pay

## 2020-12-23 ENCOUNTER — Ambulatory Visit (HOSPITAL_COMMUNITY)
Admission: RE | Admit: 2020-12-23 | Discharge: 2020-12-23 | Disposition: A | Discharge status: Home / Self Care | Payer: Self-pay | Source: Ambulatory Visit | Attending: Surgical | Admitting: Surgical

## 2020-12-23 DIAGNOSIS — S62614K Displaced fracture of proximal phalanx of right ring finger, subsequent encounter for fracture with nonunion: Secondary | ICD-10-CM | POA: Insufficient documentation

## 2020-12-24 ENCOUNTER — Ambulatory Visit (INDEPENDENT_AMBULATORY_CARE_PROVIDER_SITE_OTHER): Payer: No Typology Code available for payment source | Admitting: Plastic Surgery

## 2020-12-24 ENCOUNTER — Encounter: Payer: Self-pay | Admitting: Plastic Surgery

## 2020-12-24 VITALS — BP 138/85 | HR 102 | Temp 97.6°F

## 2020-12-24 DIAGNOSIS — S62614K Displaced fracture of proximal phalanx of right ring finger, subsequent encounter for fracture with nonunion: Secondary | ICD-10-CM

## 2020-12-24 NOTE — Progress Notes (Signed)
Patient presents about 2 months out from closed reduction percutaneous fixation of a ring finger proximal phalanx fracture.  This was a fairly comminuted fracture and has healed and acceptable alignment.  His range of motion is improving and he can now bring his fingertip all the way down to his palm.  He has a slight extensor lag due to limitations in PIP extension it seems like.  He is fairly happy with his function and has minimal pain at the site.  He is bothered by the swelling.  Examination of his films shows callus formation over the past 6 weeks or so since his last x-ray was taken.  This demonstrates interval healing of the fracture.  I explained that I thought his swelling would go down eventually but I am not surprised that there is still some swelling in that area.  I suspect he is going to go on to do fine and he desires to follow-up with [place] on an as-needed basis.

## 2020-12-30 ENCOUNTER — Ambulatory Visit: Payer: No Typology Code available for payment source | Attending: Plastic Surgery | Admitting: Occupational Therapy

## 2021-01-07 ENCOUNTER — Other Ambulatory Visit: Payer: Self-pay

## 2021-01-07 ENCOUNTER — Ambulatory Visit (INDEPENDENT_AMBULATORY_CARE_PROVIDER_SITE_OTHER): Payer: No Typology Code available for payment source | Admitting: Plastic Surgery

## 2021-01-07 ENCOUNTER — Encounter: Payer: Self-pay | Admitting: Plastic Surgery

## 2021-01-07 VITALS — BP 130/93 | HR 130

## 2021-01-07 DIAGNOSIS — S62614K Displaced fracture of proximal phalanx of right ring finger, subsequent encounter for fracture with nonunion: Secondary | ICD-10-CM

## 2021-01-07 NOTE — Progress Notes (Signed)
Patient is here just shy of 3 months postop from closed reduction percutaneous pinning of a right ring finger proximal phalanx fracture.  The initial injury was a crush injury from a car jack.  This generated a comminuted mildly displaced fracture of the proximal phalanx.  This was treated with 3 K wires which were left for 4 to 5 weeks.  He has had evidence of callus formation and healing across the fracture site on his most recent x-ray which was done about 2 weeks ago.  He is complaining quite a bit of persistent pain and stiffness related to the ring finger.  Additionally he was seen at the [facility] and upon their review someone there felt like he needed to be reevaluated for his fracture healing so he is here today.  On exam he still has mild swelling around the proximal phalanx.  There is mild discomfort to palpation.  He has about a 10 to 15 degree extensor lag at the PIP joint and can pretty much get his ring finger down to his palm with some effort.  There is no rotational malalignment.  I had a long conversation with the patient about his treatment.  I explained he did have a severe crush injury in my opinion given the extent of that injury has reasonable functional result for this time point.  I explained on his x-ray while there is a step-off along the volar cortex the degree of angulation and overall alignment of the bone is reasonable.  I explained the alternative initially would have been an open reduction internal fixation however the exposure does have potential limitations in function and I do not believe his function would be any better if that course would have been taken.  The fact that there has been bridging callus formation gives me the impression that the fracture is healing.  We discussed potential alternatives for treatment at this point.  In my opinion a reoperation at this point would not be advisable.  It would require taking down of all the bony callus that has formed and  placing a plate across the fracture potentially with some bone graft.  In my opinion the ultimate functional results of reoperation has a good chance of being worse than he is currently.  He is not satisfied with his result and is going to seek additional consultation at the [place].  I asked him to please let us know if there is anything else that we could do for him.
